# Patient Record
Sex: Female | Born: 1965 | Race: Asian | Hispanic: No | Marital: Married | Smoking: Never smoker
Health system: Southern US, Community
[De-identification: ages and names within clinical notes are randomized; demographics above are authoritative.]

## PROBLEM LIST (undated history)

## (undated) DIAGNOSIS — K5900 Constipation, unspecified: Secondary | ICD-10-CM

## (undated) DIAGNOSIS — I10 Essential (primary) hypertension: Secondary | ICD-10-CM

## (undated) DIAGNOSIS — L732 Hidradenitis suppurativa: Secondary | ICD-10-CM

## (undated) DIAGNOSIS — G4700 Insomnia, unspecified: Secondary | ICD-10-CM

## (undated) DIAGNOSIS — E079 Disorder of thyroid, unspecified: Secondary | ICD-10-CM

## (undated) HISTORY — PX: BREAST LUMPECTOMY: SHX2

## (undated) HISTORY — PX: APPENDECTOMY: SHX54

---

## 2000-01-09 ENCOUNTER — Other Ambulatory Visit: Admission: RE | Admit: 2000-01-09 | Discharge: 2000-01-09 | Payer: Self-pay | Admitting: Internal Medicine

## 2000-06-24 ENCOUNTER — Encounter: Admission: RE | Admit: 2000-06-24 | Discharge: 2000-06-24 | Payer: Self-pay | Admitting: Internal Medicine

## 2000-06-24 ENCOUNTER — Encounter: Payer: Self-pay | Admitting: Internal Medicine

## 2001-03-31 ENCOUNTER — Other Ambulatory Visit: Admission: RE | Admit: 2001-03-31 | Discharge: 2001-03-31 | Payer: Self-pay | Admitting: Internal Medicine

## 2002-03-23 ENCOUNTER — Other Ambulatory Visit: Admission: RE | Admit: 2002-03-23 | Discharge: 2002-03-23 | Payer: Self-pay | Admitting: Internal Medicine

## 2005-08-06 ENCOUNTER — Emergency Department (HOSPITAL_COMMUNITY): Admission: EM | Admit: 2005-08-06 | Discharge: 2005-08-06 | Payer: Self-pay | Admitting: Emergency Medicine

## 2010-01-23 ENCOUNTER — Emergency Department (HOSPITAL_COMMUNITY): Admission: EM | Admit: 2010-01-23 | Discharge: 2010-01-24 | Payer: Self-pay | Admitting: Emergency Medicine

## 2010-02-15 ENCOUNTER — Ambulatory Visit (HOSPITAL_COMMUNITY): Admission: RE | Admit: 2010-02-15 | Discharge: 2010-02-15 | Payer: Self-pay | Admitting: Gastroenterology

## 2011-02-05 LAB — CBC
HCT: 38.2 % (ref 36.0–46.0)
MCV: 81.6 fL (ref 78.0–100.0)
RBC: 4.69 MIL/uL (ref 3.87–5.11)
WBC: 9.5 10*3/uL (ref 4.0–10.5)

## 2011-02-05 LAB — POCT I-STAT, CHEM 8
BUN: 10 mg/dL (ref 6–23)
Calcium, Ion: 1.14 mmol/L (ref 1.12–1.32)
Chloride: 104 mEq/L (ref 96–112)
Glucose, Bld: 106 mg/dL — ABNORMAL HIGH (ref 70–99)
Potassium: 3.4 mEq/L — ABNORMAL LOW (ref 3.5–5.1)

## 2011-02-05 LAB — HEPATIC FUNCTION PANEL
ALT: 17 U/L (ref 0–35)
AST: 16 U/L (ref 0–37)
Alkaline Phosphatase: 66 U/L (ref 39–117)
Bilirubin, Direct: <0.1 mg/dL (ref 0.0–0.3)
Total Protein: 7.5 g/dL (ref 6.0–8.3)

## 2011-02-05 LAB — URINALYSIS, ROUTINE W REFLEX MICROSCOPIC
Bilirubin Urine: NEGATIVE
Ketones, ur: NEGATIVE mg/dL
Nitrite: NEGATIVE
Protein, ur: NEGATIVE mg/dL
Specific Gravity, Urine: 1.025 (ref 1.005–1.030)
Urobilinogen, UA: 0.2 mg/dL (ref 0.0–1.0)

## 2011-02-05 LAB — URINE MICROSCOPIC-ADD ON

## 2012-05-14 ENCOUNTER — Other Ambulatory Visit: Payer: Self-pay | Admitting: Hematology and Oncology

## 2012-05-28 ENCOUNTER — Emergency Department (HOSPITAL_COMMUNITY)
Admission: EM | Admit: 2012-05-28 | Discharge: 2012-05-28 | Disposition: A | Discharge status: Home / Self Care | Source: Home / Self Care | Attending: Emergency Medicine | Admitting: Emergency Medicine

## 2012-05-28 ENCOUNTER — Encounter (HOSPITAL_COMMUNITY): Payer: Self-pay | Admitting: *Deleted

## 2012-05-28 DIAGNOSIS — L0390 Cellulitis, unspecified: Secondary | ICD-10-CM

## 2012-05-28 DIAGNOSIS — L0291 Cutaneous abscess, unspecified: Secondary | ICD-10-CM

## 2012-05-28 HISTORY — DX: Hidradenitis suppurativa: L73.2

## 2012-05-28 HISTORY — DX: Disorder of thyroid, unspecified: E07.9

## 2012-05-28 MED ORDER — SULFAMETHOXAZOLE-TRIMETHOPRIM 800-160 MG PO TABS: 1.0000 | ORAL_TABLET | Freq: Two times a day (BID) | ORAL | Status: AC

## 2012-05-28 MED ORDER — LIDOCAINE HCL (PF) 2 % IJ SOLN
10.0000 mL | Freq: Once | INTRAMUSCULAR | Status: AC
  Administered 2012-05-28: 10 mL

## 2012-05-28 MED ORDER — HYDROCODONE-ACETAMINOPHEN 5-325 MG PO TABS: 2.0000 | ORAL_TABLET | ORAL | Status: AC | PRN

## 2012-05-28 MED ORDER — NAPROXEN 500 MG PO TABS: 500.0000 mg | ORAL_TABLET | Freq: Two times a day (BID) | ORAL | Status: DC

## 2012-05-28 NOTE — ED Notes (Signed)
pT  REPORTS   BOIL   IN  HER  INNNER  THIGH    NEAR   THE  PERINEAL   AREA  SHE  REPORTS   STARTED  DRAINING  SEV  DAYS  AGO    SHE  REPORTS  PAIN  AND  PRESSURE  TO  THE  AFFECTED  AREA   SHE        DENYS  ANY  OTHER  SYMPTOMS

## 2012-05-28 NOTE — ED Provider Notes (Signed)
History     CSN: [id number]  Arrival date & time 05/28/12  1336   First MD Initiated Contact with Patient 05/28/12 1433      Chief Complaint  Patient presents with  . Recurrent Skin Infections    (Consider location/radiation/quality/duration/timing/severity/associated sxs/prior treatment) HPI Comments: Patient reports a painful, erythematous mass gradually increasing size in her left perineum/groin starting 4 days ago. Has been applying warm compresses. Pain is worse with palpation. Does not recall any insect bite or trauma to the area. States it started draining some purulent material today. 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 She is not a diabetic.  ROS as noted in HPI. All other ROS negative.   Patient is a 47 y.o. female presenting with abscess. The history is provided by the patient. No language interpreter was used.  Abscess  This is a recurrent problem. The current episode started less than one week ago. The onset was gradual. The problem has been gradually worsening. The abscess is present on the groin. The abscess is characterized by redness, draining and painfulness. It is unknown what she was exposed to. The abscess first occurred at home. Pertinent negatives include no anorexia and no fever.    Past Medical History  Diagnosis Date  . Thyroid disease   . Hidradenitis     in L groin required surgery    Past Surgical History  Procedure Date  . Appendectomy   . Cesarean section     No family history on file.  History  Substance Use Topics  . Smoking status: Never Smoker   . Smokeless tobacco: Not on file  . Alcohol Use: No    OB History    Grav Para Term Preterm Abortions TAB SAB Ect Mult Living                  Review of Systems  Constitutional: Negative for fever.  Gastrointestinal: Negative for anorexia.    Allergies  Review of patient's allergies  indicates no known allergies.  Home Medications   Current Outpatient Rx  Name Route Sig Dispense Refill  . SYNTHROID PO Oral Take 250 mcg by mouth.    Marland Kitchen HYDROCODONE-ACETAMINOPHEN 5-325 MG PO TABS Oral Take 2 tablets by mouth every 4 (four) hours as needed for pain. 20 tablet 0  . NAPROXEN 500 MG PO TABS Oral Take 1 tablet (500 mg total) by mouth 2 (two) times daily. 20 tablet 0  . SULFAMETHOXAZOLE-TRIMETHOPRIM 800-160 MG PO TABS Oral Take 1 tablet by mouth 2 (two) times daily. X 10 days 20 tablet 0    BP 140/81  Pulse 88  Temp 98.1 F (36.7 C) (Oral)  Resp 16  SpO2 100%  LMP 05/25/2012  Physical Exam  Nursing note and vitals reviewed. Constitutional: She is oriented to person, place, and time. She appears well-developed and well-nourished. No distress.  HENT:  Head: Normocephalic and atraumatic.  Eyes: Conjunctivae and EOM are normal.  Neck: Normal range of motion.  Cardiovascular: Normal rate.   Pulmonary/Chest: Effort normal.  Abdominal: She exhibits no distension.  Genitourinary:          6 x 4 cm tender area of erythema, induration with a large amount of central fluctuance. Scattered pustules.  Expressible purulent drainage.   Musculoskeletal: Normal range of motion.  Neurological: She is alert and oriented to person, place, and time. Coordination normal.  Skin: Skin is warm and dry.  Psychiatric: She has a normal mood and affect. Her behavior is normal. Judgment and thought content normal.    ED Course  INCISION AND DRAINAGE Date/Time: 05/28/2012 5:32 PM Performed by: Luiz Blare Authorized by: Luiz Blare Consent: Verbal consent obtained. Risks and benefits: risks, benefits and alternatives were discussed Consent given by: patient Patient understanding: patient states understanding of the procedure being performed Patient consent: the patient's understanding of the procedure matches consent given Site marked: the operative site was  marked Required items: required blood products, implants, devices, and special equipment available Patient identity confirmed: verbally with patient Time out: Immediately prior to procedure a "time out" was called to verify the correct patient, procedure, equipment, support staff and site/side marked as required. Type: abscess Body area: anogenital Location details: perineum Anesthesia: local infiltration Local anesthetic: lidocaine 2% without epinephrine Anesthetic total: 5 ml Patient sedated: no Scalpel size: 11 Incision type: cruciate. Complexity: simple Drainage: bloody and purulent Drainage amount: moderate Wound treatment: drain placed Packing material: 1/2 in iodoform gauze Patient tolerance: Patient tolerated the procedure well with no immediate complications. Comments: Marked site with permanent marker for reference. Obtain culture, sending this off.   (including critical care time)   Labs Reviewed  CULTURE, ROUTINE-ABSCESS   No results found.   1. Abscess     MDM  Patient has obvious carbuncles, but appears to have some infected lymphatic tissue suggestive of a recurrent hidradenitis suppurativa. Patient states this is where she usually has this problem. Will send home on Norco, Naprosyn, Bactrim. Instructed her to give [place] a working phone number so we can change medications if needed. Referring to Dr. Donell Beers, surgery on call, to have this reevaluated and definitively treated if necessary. Discussed signs symptoms that should prompt her return to the department . Patient agrees with plan.   Luiz Blare, MD 05/28/12 2212

## 2012-05-31 LAB — CULTURE, ROUTINE-ABSCESS: Special Requests: NORMAL

## 2012-10-28 ENCOUNTER — Inpatient Hospital Stay (HOSPITAL_COMMUNITY)

## 2012-10-28 ENCOUNTER — Encounter (HOSPITAL_COMMUNITY): Payer: Self-pay | Admitting: *Deleted

## 2012-10-28 ENCOUNTER — Inpatient Hospital Stay (HOSPITAL_COMMUNITY)
Admission: AD | Admit: 2012-10-28 | Discharge: 2012-11-03 | DRG: 759 | Disposition: A | Discharge status: Home / Self Care | Source: Ambulatory Visit | Attending: Obstetrics | Admitting: Obstetrics

## 2012-10-28 DIAGNOSIS — N949 Unspecified condition associated with female genital organs and menstrual cycle: Secondary | ICD-10-CM | POA: Diagnosis present

## 2012-10-28 DIAGNOSIS — N739 Female pelvic inflammatory disease, unspecified: Principal | ICD-10-CM | POA: Diagnosis present

## 2012-10-28 DIAGNOSIS — J111 Influenza due to unidentified influenza virus with other respiratory manifestations: Secondary | ICD-10-CM | POA: Diagnosis present

## 2012-10-28 DIAGNOSIS — N7093 Salpingitis and oophoritis, unspecified: Secondary | ICD-10-CM

## 2012-10-28 DIAGNOSIS — N938 Other specified abnormal uterine and vaginal bleeding: Secondary | ICD-10-CM | POA: Diagnosis present

## 2012-10-28 HISTORY — DX: Constipation, unspecified: K59.00

## 2012-10-28 LAB — URINALYSIS, ROUTINE W REFLEX MICROSCOPIC
Ketones, ur: 15 mg/dL — AB
Nitrite: POSITIVE — AB
Protein, ur: >300 mg/dL — AB
Specific Gravity, Urine: 1.025 (ref 1.005–1.030)
Urobilinogen, UA: 0.2 mg/dL (ref 0.0–1.0)

## 2012-10-28 LAB — COMPREHENSIVE METABOLIC PANEL
ALT: 14 U/L (ref 0–35)
AST: 13 U/L (ref 0–37)
Alkaline Phosphatase: 76 U/L (ref 39–117)
CO2: 24 mEq/L (ref 19–32)
Calcium: 8.9 mg/dL (ref 8.4–10.5)
Chloride: 94 mEq/L — ABNORMAL LOW (ref 96–112)
GFR calc Af Amer: 76 mL/min — ABNORMAL LOW (ref >90)
GFR calc non Af Amer: 66 mL/min — ABNORMAL LOW (ref >90)
Glucose, Bld: 121 mg/dL — ABNORMAL HIGH (ref 70–99)
Potassium: 3.2 mEq/L — ABNORMAL LOW (ref 3.5–5.1)
Sodium: 130 mEq/L — ABNORMAL LOW (ref 135–145)
Total Bilirubin: 0.8 mg/dL (ref 0.3–1.2)

## 2012-10-28 LAB — URINE MICROSCOPIC-ADD ON

## 2012-10-28 LAB — CBC WITH DIFFERENTIAL/PLATELET
Basophils Absolute: 0 10*3/uL (ref 0.0–0.1)
Eosinophils Relative: 0 % (ref 0–5)
Lymphocytes Relative: 7 % — ABNORMAL LOW (ref 12–46)
Lymphs Abs: 1.4 10*3/uL (ref 0.7–4.0)
MCV: 79.6 fL (ref 78.0–100.0)
Neutro Abs: 18 10*3/uL — ABNORMAL HIGH (ref 1.7–7.7)
Platelets: 221 10*3/uL (ref 150–400)
RBC: 4.37 MIL/uL (ref 3.87–5.11)
RDW: 13.4 % (ref 11.5–15.5)
WBC: 20.8 10*3/uL — ABNORMAL HIGH (ref 4.0–10.5)

## 2012-10-28 MED ORDER — DOCUSATE SODIUM 100 MG PO CAPS
100.0000 mg | ORAL_CAPSULE | Freq: Two times a day (BID) | ORAL | Status: DC
  Administered 2012-10-28 – 2012-11-03 (×10): 100 mg via ORAL
  Filled 2012-10-28 (×11): qty 1

## 2012-10-28 MED ORDER — GENTAMICIN SULFATE 40 MG/ML IJ SOLN
130.0000 mg | Freq: Two times a day (BID) | INTRAVENOUS | Status: DC
  Administered 2012-10-28 – 2012-10-31 (×6): 130 mg via INTRAVENOUS
  Filled 2012-10-28 (×7): qty 3.25

## 2012-10-28 MED ORDER — SODIUM CHLORIDE 0.9 % IV SOLN
2.0000 g | Freq: Four times a day (QID) | INTRAVENOUS | Status: DC
  Administered 2012-10-28 – 2012-11-03 (×21): 2 g via INTRAVENOUS
  Filled 2012-10-28 (×25): qty 2000

## 2012-10-28 MED ORDER — NALOXONE HCL 0.4 MG/ML IJ SOLN: 0.4000 mg | INTRAMUSCULAR | Status: DC | PRN

## 2012-10-28 MED ORDER — DIPHENHYDRAMINE HCL 12.5 MG/5ML PO ELIX
12.5000 mg | ORAL_SOLUTION | Freq: Four times a day (QID) | ORAL | Status: DC | PRN
  Administered 2012-11-01: 12.5 mg via ORAL
  Filled 2012-10-28 (×2): qty 5

## 2012-10-28 MED ORDER — SIMETHICONE 80 MG PO CHEW
80.0000 mg | CHEWABLE_TABLET | Freq: Four times a day (QID) | ORAL | Status: DC | PRN
  Filled 2012-10-28: qty 1

## 2012-10-28 MED ORDER — CLINDAMYCIN PHOSPHATE 900 MG/50ML IV SOLN
900.0000 mg | Freq: Three times a day (TID) | INTRAVENOUS | Status: DC
  Administered 2012-10-28 – 2012-11-03 (×17): 900 mg via INTRAVENOUS
  Filled 2012-10-28 (×19): qty 50

## 2012-10-28 MED ORDER — ONDANSETRON HCL 4 MG PO TABS
4.0000 mg | ORAL_TABLET | Freq: Four times a day (QID) | ORAL | Status: DC | PRN
  Administered 2012-10-31: 4 mg via ORAL
  Filled 2012-10-28: qty 1

## 2012-10-28 MED ORDER — SODIUM CHLORIDE 0.9 % IJ SOLN: 9.0000 mL | INTRAMUSCULAR | Status: DC | PRN

## 2012-10-28 MED ORDER — IBUPROFEN 600 MG PO TABS
600.0000 mg | ORAL_TABLET | Freq: Four times a day (QID) | ORAL | Status: DC | PRN
  Administered 2012-10-29: 600 mg via ORAL
  Filled 2012-10-28: qty 1

## 2012-10-28 MED ORDER — SODIUM CHLORIDE 0.9 % IV SOLN
3.0000 g | Freq: Four times a day (QID) | INTRAVENOUS | Status: DC
  Filled 2012-10-28: qty 3

## 2012-10-28 MED ORDER — INFLUENZA VIRUS VACC SPLIT PF IM SUSP: 0.5000 mL | INTRAMUSCULAR | Status: DC

## 2012-10-28 MED ORDER — DIPHENHYDRAMINE HCL 50 MG/ML IJ SOLN: 12.5000 mg | Freq: Four times a day (QID) | INTRAMUSCULAR | Status: DC | PRN

## 2012-10-28 MED ORDER — HYDROMORPHONE 0.3 MG/ML IV SOLN
INTRAVENOUS | Status: DC
  Administered 2012-10-28: 21:00:00 via INTRAVENOUS
  Administered 2012-10-29: 0.9 mg via INTRAVENOUS
  Administered 2012-10-29: 1.2 mg via INTRAVENOUS
  Administered 2012-10-29: 0.6 mg via INTRAVENOUS
  Administered 2012-10-29: 1.2 mg via INTRAVENOUS
  Administered 2012-10-29: 0.6 mg via INTRAVENOUS
  Administered 2012-10-30 (×2): 0.9 mg via INTRAVENOUS
  Filled 2012-10-28: qty 25

## 2012-10-28 MED ORDER — ONDANSETRON HCL 4 MG/2ML IJ SOLN: 4.0000 mg | Freq: Four times a day (QID) | INTRAMUSCULAR | Status: DC | PRN

## 2012-10-28 MED ORDER — PRENATAL MULTIVITAMIN CH
1.0000 | ORAL_TABLET | Freq: Every day | ORAL | Status: DC
  Administered 2012-10-28 – 2012-11-03 (×6): 1 via ORAL
  Filled 2012-10-28 (×8): qty 1

## 2012-10-28 MED ORDER — ZOLPIDEM TARTRATE 5 MG PO TABS: 5.0000 mg | ORAL_TABLET | Freq: Every evening | ORAL | Status: DC | PRN

## 2012-10-28 MED ORDER — LACTATED RINGERS IV SOLN
INTRAVENOUS | Status: DC
  Administered 2012-10-28 – 2012-11-03 (×10): via INTRAVENOUS

## 2012-10-28 NOTE — MAU Provider Note (Signed)
History     CSN: [id number]  Arrival date and time: 10/28/12 1806   None     Chief Complaint  Patient presents with  . Abdominal Pain   HPI  Pt is not pregnant and was seen at the ED Jill Bird this weekend and treated for PID.  Pt was discharged home with antibiotics and pain meds. Pt's pain continued and pt saw Dr. Clearance Bird who sent pt here with orders for labs and ultrasound and possible admission. Pt has had chills, but no fever.  Pt's pain is no better.  Past Medical History  Diagnosis Date  . Thyroid disease   . Hidradenitis     in L groin required surgery  . Constipation     Past Surgical History  Procedure Date  . Appendectomy   . Cesarean section   . Breast lumpectomy     benign    History reviewed. No pertinent family history.  History  Substance Use Topics  . Smoking status: Never Smoker   . Smokeless tobacco: Never Used  . Alcohol Use: No    Allergies: No Known Allergies  Prescriptions prior to admission  Medication Sig Dispense Refill  . doxycycline (VIBRAMYCIN) 100 MG capsule Take 100 mg by mouth 2 (two) times daily. Pt started 10/26/2012, to take for 5 days.      Marland Kitchen HYDROcodone-acetaminophen (VICODIN) 5-500 MG per tablet Take 1 tablet by mouth every 6 (six) hours as needed. For pain      . ibuprofen (ADVIL,MOTRIN) 200 MG tablet Take 200 mg by mouth daily as needed. For pain      . levothyroxine (SYNTHROID, LEVOTHROID) 125 MCG tablet Take 125 mcg by mouth daily. Take with for a dose of .      Marland Kitchen levothyroxine (SYNTHROID, LEVOTHROID) 25 MCG tablet Take 25 mcg by mouth daily. Take with for a dose of .        Review of Systems  Constitutional: Positive for chills and diaphoresis. Negative for fever.  Gastrointestinal: Positive for abdominal pain and constipation. Negative for nausea, vomiting and diarrhea.       Pt has been constipated but had a bowel movement with much effort this morning.  Genitourinary: Negative for  dysuria.  Neurological: Positive for dizziness and headaches.   Physical Exam   Blood pressure 109/61, pulse 109, temperature 98.1 F (36.7 C), temperature source Oral, resp. rate 18, last menstrual period 10/07/2012.  Physical Exam  Vitals reviewed. Constitutional: She is oriented to person, place, and time. She appears well-developed and well-nourished.       Uncomfortable appearing  HENT:  Head: Normocephalic.  Eyes: Pupils are equal, round, and reactive to light.  Neck: Normal range of motion. Neck supple.  Cardiovascular: Normal rate, regular rhythm and normal heart sounds.   Respiratory: Effort normal and breath sounds normal.       No CVA tenderness  GI: She exhibits distension. There is tenderness. There is guarding. There is no rebound.       Extremely tender with palpation- diffuse- bilateral  Musculoskeletal: Normal range of motion.  Neurological: She is alert and oriented to person, place, and time.  Skin: Skin is warm and dry.  Psychiatric: She has a normal mood and affect.    MAU Course  Procedures Results for orders placed during the hospital encounter of 10/28/12 (from the past 24 hour(s))  URINALYSIS, ROUTINE W REFLEX MICROSCOPIC     Status: Abnormal   Collection Time   10/28/12  6:25 PM  Component Value Range   Color, Urine AMBER (*) YELLOW   APPearance CLOUDY (*) CLEAR   Specific Gravity, Urine 1.025  1.005 - 1.030   pH 5.5  5.0 - 8.0   Glucose, UA 100 (*) NEGATIVE mg/dL   Hgb urine dipstick LARGE (*) NEGATIVE   Bilirubin Urine SMALL (*) NEGATIVE   Ketones, ur 15 (*) NEGATIVE mg/dL   Protein, ur >161 (*) NEGATIVE mg/dL   Urobilinogen, UA 0.2  0.0 - 1.0 mg/dL   Nitrite POSITIVE (*) NEGATIVE   Leukocytes, UA NEGATIVE  NEGATIVE  URINE MICROSCOPIC-ADD ON     Status: Abnormal   Collection Time   10/28/12  6:25 PM      Component Value Range   Squamous Epithelial / LPF FEW (*) RARE   WBC, UA 0-2  <3 WBC/hpf   RBC / HPF 3-6  <3 RBC/hpf   Bacteria,  UA MANY (*) RARE   Casts GRANULAR CAST (*) NEGATIVE   Urine-Other YEAST    CBC WITH DIFFERENTIAL     Status: Abnormal   Collection Time   10/28/12  6:30 PM      Component Value Range   WBC 20.8 (*) 4.0 - 10.5 K/uL   RBC 4.37  3.87 - 5.11 MIL/uL   Hemoglobin 12.1  12.0 - 15.0 g/dL   HCT 09.6 (*) 04.5 - 40.9 %   MCV 79.6  78.0 - 100.0 fL   MCH 27.7  26.0 - 34.0 pg   MCHC 34.8  30.0 - 36.0 g/dL   RDW 81.1  91.4 - 78.2 %   Platelets 221  150 - 400 K/uL   Neutrophils Relative 87 (*) 43 - 77 %   Neutro Abs 18.0 (*) 1.7 - 7.7 K/uL   Lymphocytes Relative 7 (*) 12 - 46 %   Lymphs Abs 1.4  0.7 - 4.0 K/uL   Monocytes Relative 7  3 - 12 %   Monocytes Absolute 1.4 (*) 0.1 - 1.0 K/uL   Eosinophils Relative 0  0 - 5 %   Eosinophils Absolute 0.0  0.0 - 0.7 K/uL   Basophils Relative 0  0 - 1 %   Basophils Absolute 0.0  0.0 - 0.1 K/uL  COMPREHENSIVE METABOLIC PANEL     Status: Abnormal   Collection Time   10/28/12  6:30 PM      Component Value Range   Sodium 130 (*) 135 - 145 mEq/L   Potassium 3.2 (*) 3.5 - 5.1 mEq/L   Chloride 94 (*) 96 - 112 mEq/L   CO2 24  19 - 32 mEq/L   Glucose, Bld 121 (*) 70 - 99 mg/dL   BUN 8  6 - 23 mg/dL   Creatinine, Ser 9.56  0.50 - 1.10 mg/dL   Calcium 8.9  8.4 - 21.3 mg/dL   Total Protein 7.8  6.0 - 8.3 g/dL   Albumin 3.0 (*) 3.5 - 5.2 g/dL   AST 13  0 - 37 U/L   ALT 14  0 - 35 U/L   Alkaline Phosphatase 76  39 - 117 U/L   Total Bilirubin 0.8  0.3 - 1.2 mg/dL   GFR calc non Af Amer 66 (*) >90 mL/min   GFR calc Af Amer 76 (*) >90 mL/min  RADIOLOGY REPORT*  Clinical Data: Pelvic pain. Pelvic inflammatory disease.  Leukocytosis.  TRANSABDOMINAL AND TRANSVAGINAL ULTRASOUND OF PELVIS  Technique: Both transabdominal and transvaginal ultrasound  examinations of the pelvis were performed. Transabdominal  technique was performed for  global imaging of the pelvis including  uterus, ovaries, adnexal regions, and pelvic cul-de-sac.  It was necessary to proceed  with endovaginal exam following the  transabdominal exam to visualize the complex left adnexal mass.  Comparison: CT on 01/24/2010  Findings:  Uterus: 10.7 x 5.3 x 5.9 cm. Retroflexed. No fibroids or other  uterine mass identified.  Endometrium: Double layer thickness measures 9 mm transvaginally.  No focal lesion visualized.  Right ovary: Not well visualized. A small complex cystic lesion is  seen which measures approximately 2.4 x 1.5 x 1.7 cm. This shows  no central blood flow on color Doppler ultrasound. Peripheral  blood flow is demonstrated.  Left ovary: Not well visualized. A complex cystic lesion with  thick septations is seen which measures 3.9 x 3.2 x 2.6 cm. This  shows no evidence of central blood flow on color Doppler ultrasound  although peripheral blood flow is seen.  Other Findings: No free fluid  IMPRESSION:  1. Small bilateral indeterminate complex cystic adnexal masses.  Differential diagnosis includes bilateral tubal ovarian abscesses  and endometriosis. Less likely considerations include hemorrhagic  ovarian cysts or cystic ovarian neoplasms. Recommend follow-up by  pelvic ultrasound in 6-12 weeks. This recommendation follows the  consensus statement: Management of Asymptomatic Ovarian and Other  Adnexal Cysts Imaged at [place]: Society of Radiologists in Ultrasound  Consensus Conference Statement. Radiology 2010; [phone].  2. Normal appearance of the uterus. No evidence of free fluid.   Discussed with Dr. Clearance Bird- will admit pt for triple IV antibiotic therapy and pain management of TOA Pt also has a UTI- urine culture is pending Assessment and Plan    Jill Bird 10/28/2012, 6:46 PM

## 2012-10-28 NOTE — H&P (Signed)
Jill Bird is an 46 y.o. female. Presents with pelvic pain.  Pertinent Gynecological History: Menses: flow is moderate Bleeding: dysfunctional uterine bleeding Contraception: none DES exposure: denies Blood transfusions: none Sexually transmitted diseases: no past history Previous GYN Procedures: None  Last mammogram: normal Date: 2012 Last pap: normal Date: 2012 OB History: G1, P0102   Menstrual History: Menarche age:  67 Patient's last menstrual period was 10/07/2012.    Past Medical History  Diagnosis Date  . Thyroid disease   . Hidradenitis     in L groin required surgery  . Constipation     Past Surgical History  Procedure Date  . Appendectomy   . Cesarean section   . Breast lumpectomy     benign    History reviewed. No pertinent family history.  Social History:  reports that she has never smoked. She has never used smokeless tobacco. She reports that she does not drink alcohol or use illicit drugs.  Allergies: No Known Allergies  Prescriptions prior to admission  Medication Sig Dispense Refill  . doxycycline (VIBRAMYCIN) 100 MG capsule Take 100 mg by mouth 2 (two) times daily. Pt started 10/26/2012, to take for 5 days.      Marland Kitchen HYDROcodone-acetaminophen (VICODIN) 5-500 MG per tablet Take 1 tablet by mouth every 6 (six) hours as needed. For pain      . ibuprofen (ADVIL,MOTRIN) 200 MG tablet Take 200 mg by mouth daily as needed. For pain      . levothyroxine (SYNTHROID, LEVOTHROID) 100 MCG tablet Take 100 mcg by mouth daily. Takes with tablet to make a dose.      . levothyroxine (SYNTHROID, LEVOTHROID) 125 MCG tablet Take 125 mcg by mouth daily. Take with for a dose of .        Review of Systems  Constitutional: Positive for chills and diaphoresis.  All other systems reviewed and are negative.    Blood pressure 117/61, pulse 108, temperature 99.6 F (37.6 C), temperature source Oral, resp. rate 22, height 5\' 5"  (1.651 m),  weight 188 lb 0.5 oz (85.29 kg), last menstrual period 10/07/2012, SpO2 97.00%. Physical Exam  Nursing note and vitals reviewed. Constitutional: She is oriented to person, place, and time. She appears well-developed and well-nourished.  HENT:  Head: Normocephalic and atraumatic.  Eyes: Conjunctivae normal are normal. Pupils are equal, round, and reactive to light.  Neck: Normal range of motion. Neck supple.  Cardiovascular: Normal rate and regular rhythm.   Respiratory: Effort normal.  GI: Soft. There is tenderness.  Genitourinary: Vaginal discharge found.  Neurological: She is alert and oriented to person, place, and time.  Skin: Skin is warm and dry.  Psychiatric: She has a normal mood and affect. Her behavior is normal. Judgment and thought content normal.    Results for orders placed during the hospital encounter of 10/28/12 (from the past 24 hour(s))  URINALYSIS, ROUTINE W REFLEX MICROSCOPIC     Status: Abnormal   Collection Time   10/28/12  6:25 PM      Component Value Range   Color, Urine AMBER (*) YELLOW   APPearance CLOUDY (*) CLEAR   Specific Gravity, Urine 1.025  1.005 - 1.030   pH 5.5  5.0 - 8.0   Glucose, UA 100 (*) NEGATIVE mg/dL   Hgb urine dipstick LARGE (*) NEGATIVE   Bilirubin Urine SMALL (*) NEGATIVE   Ketones, ur 15 (*) NEGATIVE mg/dL   Protein, ur >161 (*) NEGATIVE mg/dL   Urobilinogen, UA 0.2  0.0 - 1.0 mg/dL   Nitrite POSITIVE (*) NEGATIVE   Leukocytes, UA NEGATIVE  NEGATIVE  URINE MICROSCOPIC-ADD ON     Status: Abnormal   Collection Time   10/28/12  6:25 PM      Component Value Range   Squamous Epithelial / LPF FEW (*) RARE   WBC, UA 0-2  <3 WBC/hpf   RBC / HPF 3-6  <3 RBC/hpf   Bacteria, UA MANY (*) RARE   Casts GRANULAR CAST (*) NEGATIVE   Urine-Other YEAST    CBC WITH DIFFERENTIAL     Status: Abnormal   Collection Time   10/28/12  6:30 PM      Component Value Range   WBC 20.8 (*) 4.0 - 10.5 K/uL   RBC 4.37  3.87 - 5.11 MIL/uL   Hemoglobin  12.1  12.0 - 15.0 g/dL   HCT 16.1 (*) 09.6 - 04.5 %   MCV 79.6  78.0 - 100.0 fL   MCH 27.7  26.0 - 34.0 pg   MCHC 34.8  30.0 - 36.0 g/dL   RDW 40.9  81.1 - 91.4 %   Platelets 221  150 - 400 K/uL   Neutrophils Relative 87 (*) 43 - 77 %   Neutro Abs 18.0 (*) 1.7 - 7.7 K/uL   Lymphocytes Relative 7 (*) 12 - 46 %   Lymphs Abs 1.4  0.7 - 4.0 K/uL   Monocytes Relative 7  3 - 12 %   Monocytes Absolute 1.4 (*) 0.1 - 1.0 K/uL   Eosinophils Relative 0  0 - 5 %   Eosinophils Absolute 0.0  0.0 - 0.7 K/uL   Basophils Relative 0  0 - 1 %   Basophils Absolute 0.0  0.0 - 0.1 K/uL  COMPREHENSIVE METABOLIC PANEL     Status: Abnormal   Collection Time   10/28/12  6:30 PM      Component Value Range   Sodium 130 (*) 135 - 145 mEq/L   Potassium 3.2 (*) 3.5 - 5.1 mEq/L   Chloride 94 (*) 96 - 112 mEq/L   CO2 24  19 - 32 mEq/L   Glucose, Bld 121 (*) 70 - 99 mg/dL   BUN 8  6 - 23 mg/dL   Creatinine, Ser 7.82  0.50 - 1.10 mg/dL   Calcium 8.9  8.4 - 95.6 mg/dL   Total Protein 7.8  6.0 - 8.3 g/dL   Albumin 3.0 (*) 3.5 - 5.2 g/dL   AST 13  0 - 37 U/L   ALT 14  0 - 35 U/L   Alkaline Phosphatase 76  39 - 117 U/L   Total Bilirubin 0.8  0.3 - 1.2 mg/dL   GFR calc non Af Amer 66 (*) >90 mL/min   GFR calc Af Amer 76 (*) >90 mL/min    US Transvaginal Non-ob  10/28/2012  *RADIOLOGY REPORT*  Clinical Data: Pelvic pain.  Pelvic inflammatory disease. Leukocytosis.  TRANSABDOMINAL AND TRANSVAGINAL ULTRASOUND OF PELVIS  Technique:  Both transabdominal and transvaginal ultrasound examinations of the pelvis were performed.  Transabdominal technique was performed for global imaging of the pelvis including uterus, ovaries, adnexal regions, and pelvic cul-de-sac.  It was necessary to proceed with endovaginal exam following the transabdominal exam to visualize the complex left adnexal mass.  Comparison:  CT on 01/24/2010  Findings: Uterus:  10.7 x 5.3 x 5.9 cm.  Retroflexed.  No fibroids or other uterine mass identified.   Endometrium: Double layer thickness measures 9 mm transvaginally. No focal  lesion visualized.  Right ovary: Not well visualized.  A small complex cystic lesion is seen which measures approximately 2.4 x 1.5 x 1.7 cm.  This shows no central blood flow on color Doppler ultrasound.  Peripheral blood flow is demonstrated.  Left ovary: Not well visualized.  A complex cystic lesion with thick septations is seen which measures 3.9 x 3.2 x 2.6 cm.  This shows no evidence of central blood flow on color Doppler ultrasound although peripheral blood flow is seen.  Other Findings:  No free fluid  IMPRESSION:  1. Small bilateral indeterminate complex cystic adnexal masses. Differential diagnosis includes bilateral tubal ovarian abscesses and endometriosis.  Less likely considerations include hemorrhagic ovarian cysts or cystic ovarian neoplasms.  Recommend follow-up by pelvic ultrasound in 6-12 weeks. This recommendation follows the consensus statement:  Management of Asymptomatic Ovarian and Other Adnexal Cysts Imaged at [place]:  Society of Radiologists in Ultrasound Consensus Conference Statement.  Radiology 2010; [phone]. 2.  Normal appearance of the uterus.  No evidence of free fluid.   Original Report Authenticated By: Myles Rosenthal, M.D.    US Pelvis Complete  10/28/2012  *RADIOLOGY REPORT*  Clinical Data: Pelvic pain.  Pelvic inflammatory disease. Leukocytosis.  TRANSABDOMINAL AND TRANSVAGINAL ULTRASOUND OF PELVIS  Technique:  Both transabdominal and transvaginal ultrasound examinations of the pelvis were performed.  Transabdominal technique was performed for global imaging of the pelvis including uterus, ovaries, adnexal regions, and pelvic cul-de-sac.  It was necessary to proceed with endovaginal exam following the transabdominal exam to visualize the complex left adnexal mass.  Comparison:  CT on 01/24/2010  Findings: Uterus:  10.7 x 5.3 x 5.9 cm.  Retroflexed.  No fibroids or other uterine mass identified.   Endometrium: Double layer thickness measures 9 mm transvaginally. No focal lesion visualized.  Right ovary: Not well visualized.  A small complex cystic lesion is seen which measures approximately 2.4 x 1.5 x 1.7 cm.  This shows no central blood flow on color Doppler ultrasound.  Peripheral blood flow is demonstrated.  Left ovary: Not well visualized.  A complex cystic lesion with thick septations is seen which measures 3.9 x 3.2 x 2.6 cm.  This shows no evidence of central blood flow on color Doppler ultrasound although peripheral blood flow is seen.  Other Findings:  No free fluid  IMPRESSION:  1. Small bilateral indeterminate complex cystic adnexal masses. Differential diagnosis includes bilateral tubal ovarian abscesses and endometriosis.  Less likely considerations include hemorrhagic ovarian cysts or cystic ovarian neoplasms.  Recommend follow-up by pelvic ultrasound in 6-12 weeks. This recommendation follows the consensus statement:  Management of Asymptomatic Ovarian and Other Adnexal Cysts Imaged at [place]:  Society of Radiologists in Ultrasound Consensus Conference Statement.  Radiology 2010; [phone]. 2.  Normal appearance of the uterus.  No evidence of free fluid.   Original Report Authenticated By: Myles Rosenthal, M.D.     Assessment/Plan: PID/TOA.  Admit.  IV antibiotics.  HARPER,CHARLES A 10/28/2012, 11:52 PM

## 2012-10-28 NOTE — MAU Note (Signed)
Seen in ED in [place] this weekend, was tx'd for PID.  Dc'd home with antibiotics & pain meds, saw Dr. Clearance Coots this evening, was sent to MAU for possible admission. Severe pain continues.

## 2012-10-28 NOTE — Progress Notes (Signed)
ANTIBIOTIC CONSULT NOTE - INITIAL  Pharmacy Consult for Gentamicin Indication: TOA  No Known Allergies  Patient Measurements: Height: 5\' 5"  (165.1 cm) Weight: 188 lb 0.5 oz (85.29 kg) IBW/kg (Calculated) : 57  Adjusted Body Weight: 65.6kg  Vital Signs: Temp: 99.1 F (37.3 C) (12/17 2054) Temp src: Oral (12/17 2054) BP: 117/62 mmHg (12/17 2054) Pulse Rate: 87  (12/17 2054)  Labs:  Basename 10/28/12 1830  WBC 20.8*  HGB 12.1  PLT 221  LABCREA --  CREATININE 1.02  CRCLEARANCE --   No results found for this basename: GENTTROUGH:2,GENTPEAK:2,GENTRANDOM:2, in the last 72 hours   Microbiology: No results found for this or any previous visit (from the past 720 hour(s)).  Medications:  Ampicillin 2 grams IV every 6 hours. Clindamycin 900mg  IV every 8 hours.  Assessment: 46 y.o. non pregnant female admitted for TOA.  Patient has been on oral abx since this weekend for PID.  She is still in a lot of pain with chills, but no fever.  Admitted for triple abx and pain management.  Per MAU note, the patient also has a UTI with urine cx pending.    Estimated Ke = 0.196 , Vd = 0.3L/kg, SCr = 1.02mg /dL  Goal of Therapy:  Gentamicin peak 6-8 mg/L and Trough < 1 mg/L  Plan:  Gentamicin 130 mg IV every 12 hrs  Will check gentamicin levels if continued > 72hr or clinically indicated.  Berlin Hun D 10/28/2012,9:05 PM

## 2012-10-28 NOTE — MAU Note (Signed)
Pt states she received toradol injection at MD office.

## 2012-10-29 ENCOUNTER — Encounter (HOSPITAL_COMMUNITY): Payer: Self-pay | Admitting: Obstetrics

## 2012-10-29 MED ORDER — FLUCONAZOLE 150 MG PO TABS
150.0000 mg | ORAL_TABLET | Freq: Once | ORAL | Status: AC
  Administered 2012-10-29: 150 mg via ORAL
  Filled 2012-10-29: qty 1

## 2012-10-29 MED ORDER — LEVOTHYROXINE SODIUM 75 MCG PO TABS
225.0000 ug | ORAL_TABLET | Freq: Every day | ORAL | Status: DC
  Administered 2012-10-29 – 2012-11-03 (×5): 225 ug via ORAL
  Filled 2012-10-29 (×7): qty 3

## 2012-10-29 MED ORDER — INFLUENZA VIRUS VACC SPLIT PF IM SUSP: 0.5000 mL | INTRAMUSCULAR | Status: AC

## 2012-10-29 MED ORDER — IBUPROFEN 600 MG PO TABS
600.0000 mg | ORAL_TABLET | Freq: Four times a day (QID) | ORAL | Status: DC
  Administered 2012-10-29 – 2012-11-02 (×12): 600 mg via ORAL
  Filled 2012-10-29 (×12): qty 1

## 2012-10-29 NOTE — Progress Notes (Signed)
Subjective: Patient reports a little less pain.  Objective: I have reviewed patient's vital signs, intake and output, medications, labs and microbiology.  General: alert and no distress Resp: clear to auscultation bilaterally Cardio: regular rate and rhythm, S1, S2 normal, no murmur, click, rub or gallop GI: abnormal findings:  moderate tenderness in the RLQ and in the LLQ Extremities: extremities normal, atraumatic, no cyanosis or edema Vaginal Bleeding: none   Assessment/Plan: PID/TOA.  Stable.  Continue triple antibiotics.  Check cultures.  LOS: 1 day    Nelida Mandarino A 10/29/2012, 11:12 AM

## 2012-10-30 LAB — URINE CULTURE
Colony Count: NO GROWTH
Culture: NO GROWTH

## 2012-10-30 MED ORDER — HYDROMORPHONE HCL 2 MG PO TABS
4.0000 mg | ORAL_TABLET | ORAL | Status: DC | PRN
  Administered 2012-10-30 – 2012-11-03 (×7): 4 mg via ORAL
  Filled 2012-10-30 (×6): qty 2
  Filled 2012-10-30 (×2): qty 1

## 2012-10-30 MED ORDER — VALACYCLOVIR HCL 500 MG PO TABS
2000.0000 mg | ORAL_TABLET | Freq: Two times a day (BID) | ORAL | Status: AC
  Administered 2012-10-30: 2000 mg via ORAL
  Filled 2012-10-30 (×2): qty 4

## 2012-10-30 NOTE — Progress Notes (Signed)
Subjective: Patient reports less pain.  Objective: I have reviewed patient's vital signs, intake and output, medications, labs and microbiology.  Abdomen:  Diffusely tender lower quadrants.   Assessment/Plan: PID/TOA.  Stable.  Continue triple antibiotics.  Start po analgesic.  D/C PCA.  LOS: 2 days    Jill Bird 10/30/2012, 9:16 AM

## 2012-10-31 LAB — CREATININE, SERUM
Creatinine, Ser: 0.82 mg/dL (ref 0.50–1.10)
GFR calc non Af Amer: 86 mL/min — ABNORMAL LOW (ref >90)

## 2012-10-31 LAB — GENTAMICIN LEVEL, TROUGH: Gentamicin Trough: 0.9 ug/mL (ref 0.5–2.0)

## 2012-10-31 MED ORDER — GENTAMICIN SULFATE 40 MG/ML IJ SOLN
150.0000 mg | Freq: Two times a day (BID) | INTRAVENOUS | Status: DC
  Administered 2012-10-31 – 2012-11-03 (×6): 150 mg via INTRAVENOUS
  Filled 2012-10-31 (×7): qty 3.75

## 2012-10-31 NOTE — Progress Notes (Signed)
Chaplain told at rotation time of request by patient for Chaplain support. Arrived in unit at 1650. Ms Lenis's anxiety over her diagnosis has lessen after discussions with physicians and nurses. She is still concerned about what may develop but is hopeful that she will be cured. Her religious faith is a sustaining factor in her life. She reports a strong family support system, but worries that her health conditions my cause worry to others.  Prayer and spiritual care given. Please page chaplain if he can assist.   Jill Bird. Harland Aguiniga, D.Min., APC Chaplain

## 2012-11-01 ENCOUNTER — Inpatient Hospital Stay (HOSPITAL_COMMUNITY)

## 2012-11-01 LAB — CBC WITH DIFFERENTIAL/PLATELET
Basophils Absolute: 0 10*3/uL (ref 0.0–0.1)
Eosinophils Absolute: 0.1 10*3/uL (ref 0.0–0.7)
Lymphs Abs: 0.8 10*3/uL (ref 0.7–4.0)
MCH: 27 pg (ref 26.0–34.0)
MCHC: 34.8 g/dL (ref 30.0–36.0)
MCV: 77.4 fL — ABNORMAL LOW (ref 78.0–100.0)
Monocytes Absolute: 0.7 10*3/uL (ref 0.1–1.0)
Platelets: 259 10*3/uL (ref 150–400)
RDW: 14.1 % (ref 11.5–15.5)
WBC: 9 10*3/uL (ref 4.0–10.5)

## 2012-11-01 LAB — INFLUENZA PANEL BY PCR (TYPE A & B)
Influenza A By PCR: POSITIVE — AB
Influenza B By PCR: NEGATIVE

## 2012-11-01 MED ORDER — OSELTAMIVIR PHOSPHATE 75 MG PO CAPS
75.0000 mg | ORAL_CAPSULE | Freq: Two times a day (BID) | ORAL | Status: DC
  Administered 2012-11-01 – 2012-11-03 (×4): 75 mg via ORAL
  Filled 2012-11-01 (×6): qty 1

## 2012-11-01 MED ORDER — GUAIFENESIN-DM 100-10 MG/5ML PO SYRP
10.0000 mL | ORAL_SOLUTION | ORAL | Status: DC | PRN
  Administered 2012-11-01 – 2012-11-02 (×4): 10 mL via ORAL
  Filled 2012-11-01 (×3): qty 10

## 2012-11-01 NOTE — Progress Notes (Signed)
Subjective: Patient reports less pain.    Objective: I have reviewed patient's vital signs, intake and output, medications, labs and microbiology.  General: alert and no distress GI: abnormal findings:  mild tenderness in the RLQ and in the LLQ Extremities: extremities normal, atraumatic, no cyanosis or edema Vaginal Bleeding: none   Assessment/Plan: Pelvic.  Probable PID/TOA.  Improved.  Continue IV triples x 5-7 days total`.  LOS: 4 days    Jill Bird A 11/01/2012, 7:50 AM

## 2012-11-02 LAB — BASIC METABOLIC PANEL
CO2: 27 mEq/L (ref 19–32)
Calcium: 8.2 mg/dL — ABNORMAL LOW (ref 8.4–10.5)
Creatinine, Ser: 1.03 mg/dL (ref 0.50–1.10)
GFR calc non Af Amer: 65 mL/min — ABNORMAL LOW (ref >90)
Glucose, Bld: 95 mg/dL (ref 70–99)
Sodium: 135 mEq/L (ref 135–145)

## 2012-11-02 MED ORDER — IBUPROFEN 600 MG PO TABS: 600.0000 mg | ORAL_TABLET | Freq: Four times a day (QID) | ORAL | Status: DC

## 2012-11-02 MED ORDER — IBUPROFEN 600 MG PO TABS
600.0000 mg | ORAL_TABLET | Freq: Four times a day (QID) | ORAL | Status: DC
  Administered 2012-11-02: 600 mg via ORAL
  Filled 2012-11-02: qty 1

## 2012-11-02 NOTE — Progress Notes (Addendum)
ANTIBIOTIC CONSULT NOTE - FOLLOW UP  Pharmacy Consult for Gentamicin Indication: PID/TOA  No Known Allergies  Patient Measurements: Height: 5\' 5"  (165.1 cm) Weight: 188 lb (85.276 kg) IBW/kg (Calculated) : 57  Adjusted Body Weight: 65.6 kg  Vital Signs: Temp: 98.2 F (36.8 C) (12/22 1000) Temp src: Oral (12/22 1000) BP: 130/69 mmHg (12/22 1000) Pulse Rate: 78  (12/22 1000) Intake/Output from previous day: 12/21 0701 - 12/22 0700 In: 45409 [P.O.:2270; I.V.:6982.5; IV Piggyback:957.5] Out: 4450 [Urine:4450] Intake/Output from this shift: Total I/O In: 120 [P.O.:120] Out: -   Labs:  Basename 11/02/12 0900 11/01/12 1200 10/31/12 1024  WBC -- 9.0 --  HGB -- 10.0* --  PLT -- 259 --  LABCREA -- -- --  CREATININE 1.03 -- 0.82   Estimated Creatinine Clearance: 75.2 ml/min (by C-G formula based on Cr of 1.03).  Basename 10/31/12 1354 10/31/12 1024  VANCOTROUGH -- --  Leodis Binet -- --  Drue Dun -- --  GENTTROUGH -- 0.9  GENTPEAK 4.1* --  GENTRANDOM -- --  TOBRATROUGH -- --  TOBRAPEAK -- --  TOBRARND -- --  AMIKACINPEAK -- --  AMIKACINTROU -- --  AMIKACIN -- --     Microbiology: Recent Results (from the past 720 hour(s))  URINE CULTURE     Status: Normal   Collection Time   10/28/12  6:25 PM      Component Value Range Status Comment   Specimen Description URINE, CLEAN CATCH   Final    Special Requests NONE   Final    Culture  Setup Time 10/29/2012 19:04   Final    Colony Count NO GROWTH   Final    Culture NO GROWTH   Final    Report Status 10/30/2012 FINAL   Final    Influenza A +; H1N1 +  Anti-infectives     Start     Dose/Rate Route Frequency Ordered Stop   11/01/12 2200   oseltamivir (TAMIFLU) capsule 75 mg        75 mg Oral 2 times daily 11/01/12 1905 11/06/12 2159   10/31/12 2300   gentamicin (GARAMYCIN) 150 mg in dextrose 5 % 50 mL IVPB        150 mg 107.5 mL/hr over 30 Minutes Intravenous Every 12 hours 10/31/12 1517     10/30/12 1000    valACYclovir (VALTREX) tablet 2,000 mg        2,000 mg Oral 2 times daily 10/30/12 0927 10/31/12 0959   10/29/12 1100   fluconazole (DIFLUCAN) tablet 150 mg        150 mg Oral  Once 10/29/12 1004 10/29/12 1050   10/28/12 2300   gentamicin (GARAMYCIN) 130 mg in dextrose 5 % 50 mL IVPB  Status:  Discontinued        130 mg 106.5 mL/hr over 30 Minutes Intravenous Every 12 hours 10/28/12 2100 10/31/12 1517   10/28/12 2200   clindamycin (CLEOCIN) IVPB 900 mg        900 mg 100 mL/hr over 30 Minutes Intravenous 3 times per day 10/28/12 1957     10/28/12 2100   Ampicillin-Sulbactam (UNASYN) 3 g in sodium chloride 0.9 % 100 mL IVPB  Status:  Discontinued        3 g 100 mL/hr over 60 Minutes Intravenous 4 times per day 10/28/12 1957 10/28/12 2002   10/28/12 2100   ampicillin (OMNIPEN) 2 g in sodium chloride 0.9 % 50 mL IVPB        2 g 150 mL/hr over  20 Minutes Intravenous 4 times per day 10/28/12 2005            Assessment: Pt still spiking temps. Tested positive for Influenza A and H1N1.  Started on Tamiflu 11/01/12. Pt is on Day 5 of Ampicillin, Gentamicin, & Clindamycin.  WBC yesterday was down to 9.  SCr from today is 1.03.  Pt's SCr on admission was 1.02 which dropped to 0.82 and has now increased back to 1.03.  Goal of Therapy:  Gentamicin peak 7-8 mcg/ml and Gentamicin trough <1 mcg/ml  Plan:  Continue gentamicin at current dose.   Will check a gentamicin trough level with next dose to make sure pt is clearing medication appropriately.    Natasha Bence 11/02/2012,12:13 PM

## 2012-11-02 NOTE — Progress Notes (Signed)
Subjective: Patient reports less pain.  Objective: I have reviewed patient's vital signs, intake and output, medications, labs and microbiology.  General: alert and no distress Resp: clear to auscultation bilaterally Cardio: regular rate and rhythm, S1, S2 normal, no murmur, click, rub or gallop GI: abnormal findings:  mild tenderness in the RLQ and in the LLQ Extremities: extremities normal, atraumatic, no cyanosis or edema Vaginal Bleeding: none   Assessment/Plan: Pelvic pain.  Probable PID/TOA.  Improved.  Recent febrile episode with positive Influenza A testing.  Tamiflu started.  Continue triple antibiotics.  LOS: 5 days    Jill Bird A 11/02/2012, 7:10 AM

## 2012-11-03 DIAGNOSIS — N739 Female pelvic inflammatory disease, unspecified: Principal | ICD-10-CM | POA: Diagnosis present

## 2012-11-03 MED ORDER — METRONIDAZOLE 500 MG PO TABS: 500.0000 mg | ORAL_TABLET | Freq: Two times a day (BID) | ORAL | Status: DC

## 2012-11-03 MED ORDER — DOXYCYCLINE HYCLATE 100 MG PO CAPS: 100.0000 mg | ORAL_CAPSULE | Freq: Two times a day (BID) | ORAL | Status: DC

## 2012-11-03 MED ORDER — HYDROMORPHONE HCL 4 MG PO TABS: 4.0000 mg | ORAL_TABLET | ORAL | Status: DC | PRN

## 2012-11-03 NOTE — Progress Notes (Signed)
Pt is discharged in the care of husband Downstairs per wheelchair. Stable. Denies any pain or discomfort. Denies vaginal bleeding or discharge. No temp. Understands all discharge instructions well. Questions were asked and answered.

## 2012-11-03 NOTE — Progress Notes (Signed)
Subjective: Patient reports less pain.   Objective: I have reviewed patient's vital signs, intake and output, medications, labs and microbiology.  General: alert and no distress Resp: clear to auscultation bilaterally Cardio: regular rate and rhythm, S1, S2 normal, no murmur, click, rub or gallop GI: abnormal findings:  mild tenderness in the RLQ and in the LUQ Extremities: extremities normal, atraumatic, no cyanosis or edema Vaginal Bleeding: none   Assessment/Plan: Pelvic pain.  Probable PID/TOA.  Improved.  Influenza A.  Stable.  Discharge home on 10 days Doxy/Flagyl and Tamiflu respectively.  F/U in office 2 weeks.  LOS: 6 days    Seferina Brokaw A 11/03/2012, 10:06 AM

## 2012-11-03 NOTE — Progress Notes (Signed)
Pharmacy  Gentamicin Per Pharmacy  S/O: 46 yo with PID/TOA.  Day 3 on ampicillin, gentamicin, and clindamycin.  A:  Drew gentamicin levels. Gent trough 0.9 at 1030, peak 4.1 at 1359. SCr lower today=0.82.  Gent extrapolated trough= 0.8, Gent extrapolated peak= 7.  Pt had fever 12/19 at 1800- 101.5 F. Afebrile otherwise.  Per Dr. Clearance Coots, keeping IV abx through weekend and planning to D/C home on Monday.   P:  Increased gentamicin dose to 150mg  Q12 hours to start 12/20 at 2300 because wanted higher peak since had fever last night. Estimated peak with new dose 7.9, estimated trough still <1.   Will continue to monitor closely.    Thank you,   Smiley Houseman, PharmD

## 2012-11-03 NOTE — Discharge Summary (Signed)
Physician Discharge Summary  Patient ID: Jill Bird MRN: [id number] DOB/AGE: [birth year] 46 y.o.  Admit date: 10/28/2012 Discharge date: 11/03/2012  Admission Diagnoses: Active Problems:  Unspecified inflammatory disease of female pelvic organs and tissues   Discharge Diagnoses:  Active Problems:  Unspecified inflammatory disease of female pelvic organs and tissues   Discharged Condition: stable  Hospital Course: The patient was admitted and started on broad spectrum, parenteral antibiotics.  Imaging was worrisome for bilateral tubo-ovarian abscesses.  Her symptoms gradually improved.  Consults: None  Significant Diagnostic Studies: radiology: CT scan: see above and Ultrasound: see above  Treatments: antibiotics: gentamycin and clindamycin  Discharge Exam: Blood pressure 135/83, pulse 78, temperature 98.7 F (37.1 C), temperature source Oral, resp. rate 20, height 5\' 5"  (1.651 m), weight 85.276 kg (188 lb), last menstrual period 10/07/2012, SpO2 98.00%. General appearance: alert GI: soft, non-tender; bowel sounds normal; no masses,  no organomegaly Extremities: extremities normal, atraumatic, no cyanosis or edema  Disposition: 01-Home or Self Care  Discharge Orders    Future Orders Please Complete By Expires   Diet - low sodium heart healthy      Activity as tolerated - No restrictions      Driving Restrictions      Comments:   Avoid driving while taking narcotics.   Sexual Activity Restrictions      Comments:   No intercourse for 6  weeks   Call MD for:  temperature >100.4      Call MD for:  persistant nausea and vomiting      Call MD for:  severe uncontrolled pain      Call MD for:  persistant dizziness or light-headedness      Call MD for:  extreme fatigue          Medication List     As of 11/03/2012 10:11 AM    STOP taking these medications         HYDROcodone-acetaminophen 5-500 MG per tablet   Commonly known as: VICODIN      TAKE these  medications         doxycycline 100 MG capsule   Commonly known as: VIBRAMYCIN   Take 1 capsule (100 mg total) by mouth 2 (two) times daily. Take for 10 days.      HYDROmorphone 4 MG tablet   Commonly known as: DILAUDID   Take 1 tablet (4 mg total) by mouth every 4 (four) hours as needed.      ibuprofen 200 MG tablet   Commonly known as: ADVIL,MOTRIN   Take 200 mg by mouth daily as needed. For pain      levothyroxine 125 MCG tablet   Commonly known as: SYNTHROID, LEVOTHROID   Take 125 mcg by mouth daily. Take with for a dose of .      levothyroxine 100 MCG tablet   Commonly known as: SYNTHROID, LEVOTHROID   Take 100 mcg by mouth daily. Takes with tablet to make a dose.      metroNIDAZOLE 500 MG tablet   Commonly known as: FLAGYL   Take 1 tablet (500 mg total) by mouth 2 (two) times daily.           Follow-up Information    Follow up with HARPER,CHARLES A, MD. Schedule an appointment as soon as possible for a visit in 2 weeks.   Contact information:   [address] [place] [address] [phone]  SignedAntionette Char A 11/03/2012, 10:11 AM

## 2012-11-07 LAB — CULTURE, BLOOD (ROUTINE X 2): Culture: NO GROWTH

## 2013-03-02 ENCOUNTER — Ambulatory Visit: Payer: Self-pay | Admitting: Obstetrics

## 2013-03-05 ENCOUNTER — Encounter: Payer: Self-pay | Admitting: Obstetrics

## 2013-03-05 ENCOUNTER — Ambulatory Visit (INDEPENDENT_AMBULATORY_CARE_PROVIDER_SITE_OTHER): Admitting: Obstetrics

## 2013-03-05 VITALS — BP 142/87 | HR 77 | Temp 98.5°F | Ht 65.0 in | Wt 193.6 lb

## 2013-03-05 DIAGNOSIS — N739 Female pelvic inflammatory disease, unspecified: Secondary | ICD-10-CM

## 2013-03-05 MED ORDER — METRONIDAZOLE 500 MG PO TABS: 500.0000 mg | ORAL_TABLET | Freq: Two times a day (BID) | ORAL | Status: AC

## 2013-03-05 MED ORDER — METRONIDAZOLE 500 MG PO TABS: 500.0000 mg | ORAL_TABLET | Freq: Two times a day (BID) | ORAL | Status: DC

## 2013-03-05 MED ORDER — DOXYCYCLINE HYCLATE 100 MG PO CAPS: 100.0000 mg | ORAL_CAPSULE | Freq: Two times a day (BID) | ORAL | Status: AC

## 2013-03-05 MED ORDER — DOXYCYCLINE HYCLATE 100 MG PO CAPS: 100.0000 mg | ORAL_CAPSULE | Freq: Two times a day (BID) | ORAL | Status: DC

## 2013-03-05 NOTE — Patient Instructions (Signed)
PID

## 2013-03-05 NOTE — Progress Notes (Signed)
.   Subjective:     Jill Bird is a 47 y.o. female here for a follow up and pelvic ultrasound .  Current complaints - very heavy cycles.  Personal health questionnaire reviewed: yes.   Gynecologic History Patient's last menstrual period was 02/15/2013. Contraception: none Last Pap:  09/2012 - normal Last mammogram: 02/2013 - normal  Obstetric History OB History   Grav Para Term Preterm Abortions TAB SAB Ect Mult Living   1 1  1     1 2      # Outc Date GA Lbr Len/2nd Wgt Sex Del Anes PTL Lv   1 PRE                The following portions of the patient's history were reviewed and updated as appropriate: allergies, current medications, past family history, past medical history, past social history, past surgical history and problem list.  Review of Systems Pertinent items are noted in HPI.    Objective:    No exam performed today, Consult only.    Assessment:    PID.  Resolving.  Ultrasound improved. 10 day course of Doxy/Flagyl Rx.   Plan:    Education reviewed: safe sex/STD prevention and PID. Follow up in: 2 weeks.  2 months.

## 2013-03-06 ENCOUNTER — Encounter: Payer: Self-pay | Admitting: Obstetrics

## 2013-03-17 IMAGING — US US PELVIS COMPLETE
1 series · 13 of 25 positions shown · non-contrast
Comparison: CT on 01/24/2010

CLINICAL DATA: Pelvic pain.  Pelvic inflammatory disease.
Leukocytosis.



[Series 1: us pelvis complete · 13 of 61 slices shown]
[im 1/61]
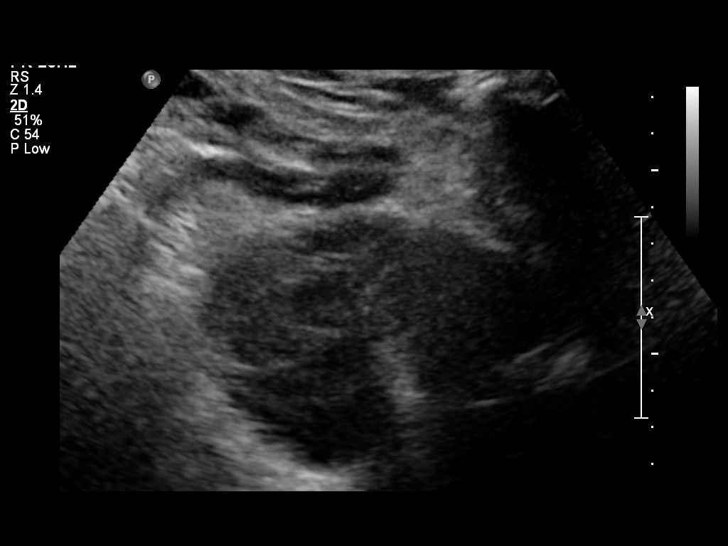
[im 6/61]
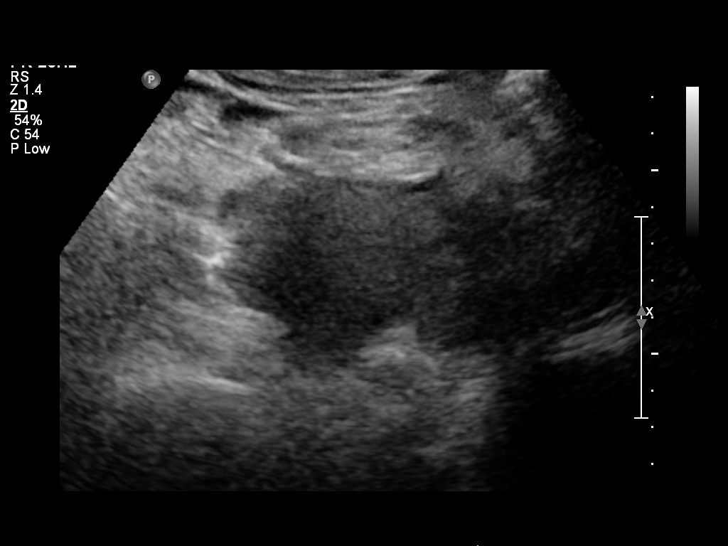
[im 11/61]
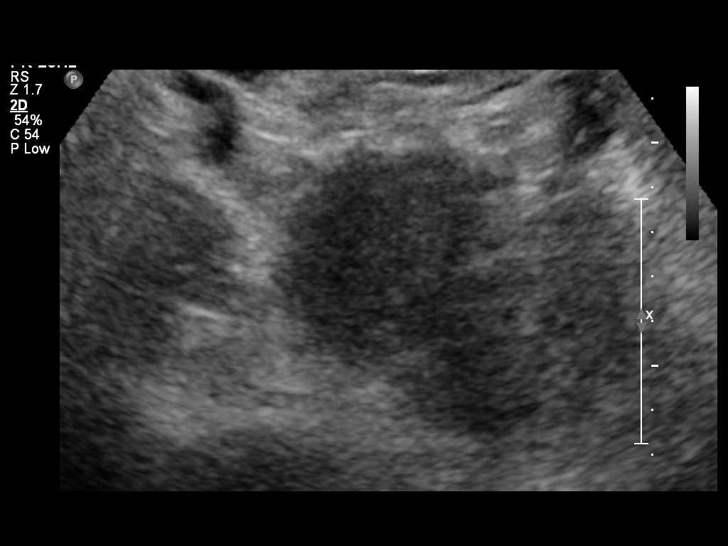
[im 16/61]
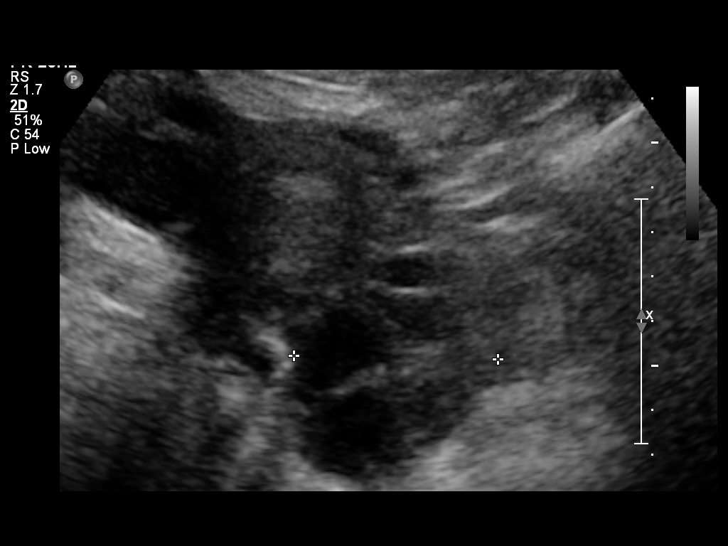
[im 21/61]
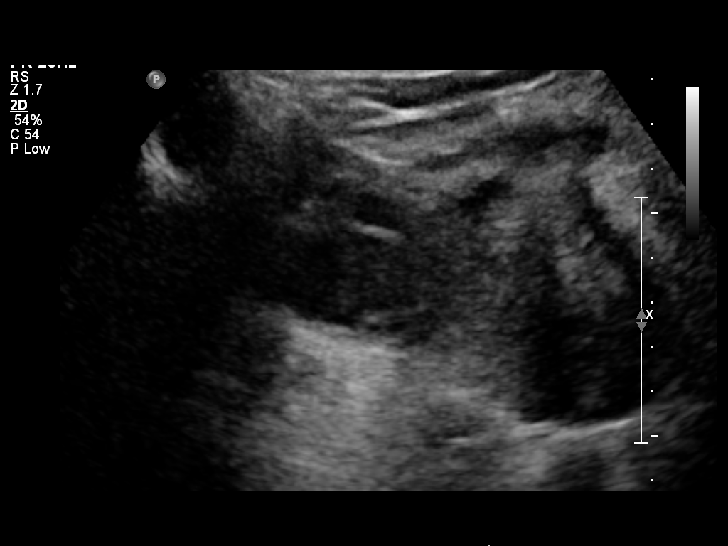
[im 26/61]
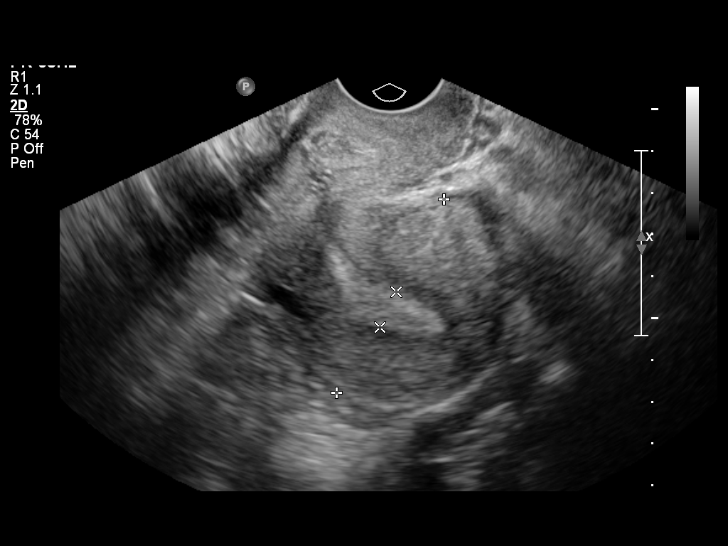
[im 31/61]
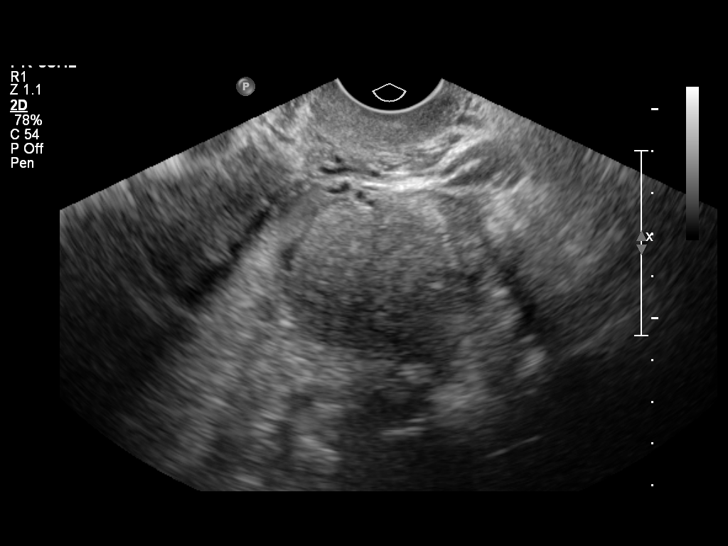
[im 36/61]
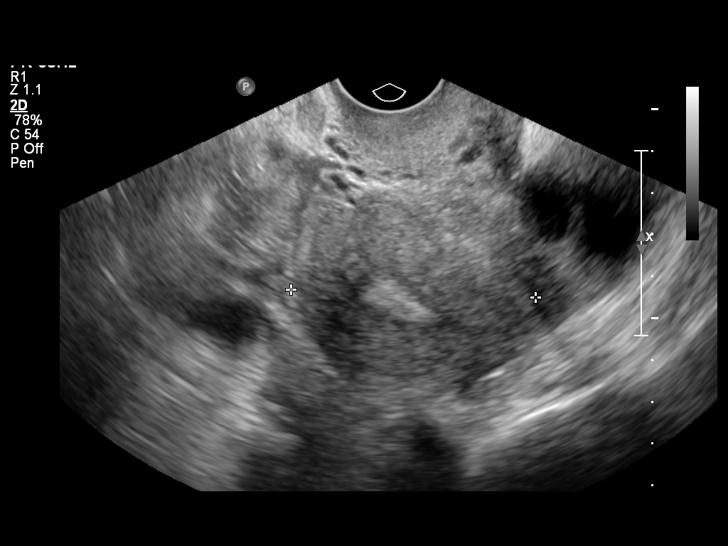
[im 41/61]
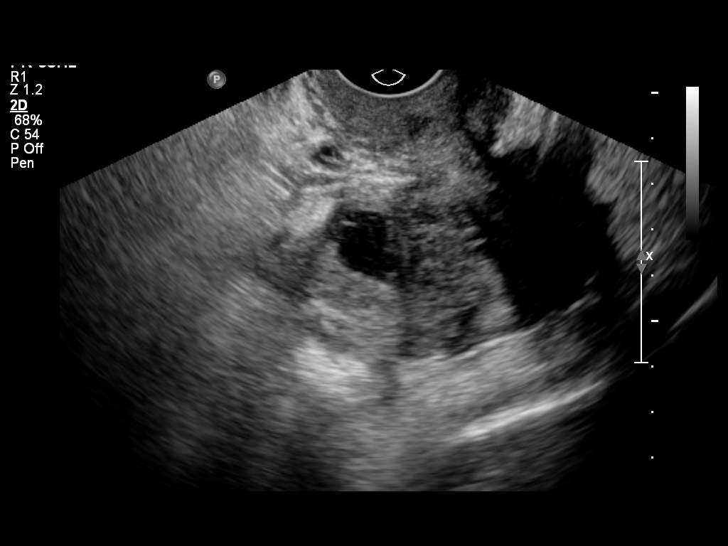
[im 46/61]
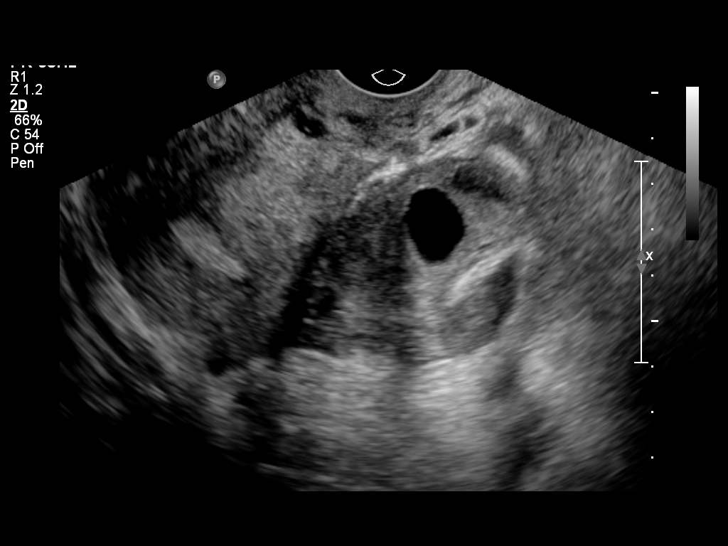
[im 51/61]
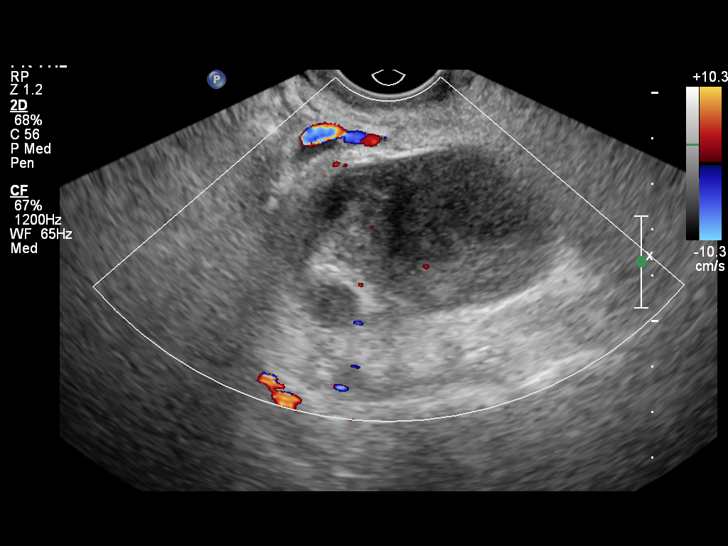
[im 56/61]
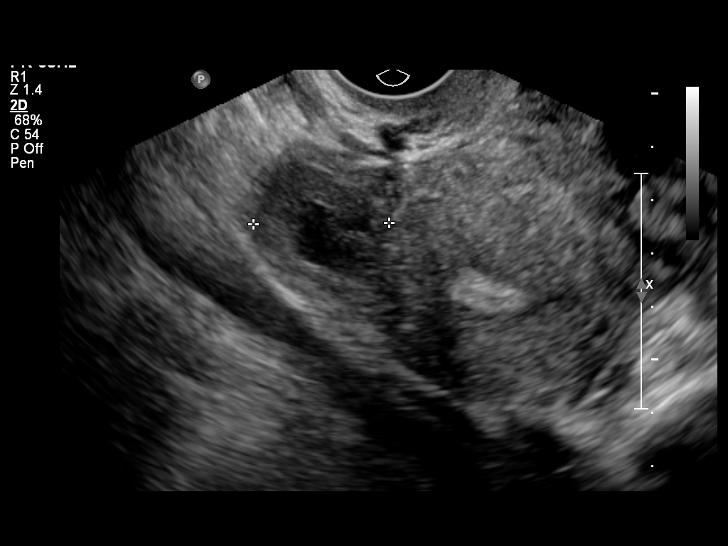
[im 61/61]
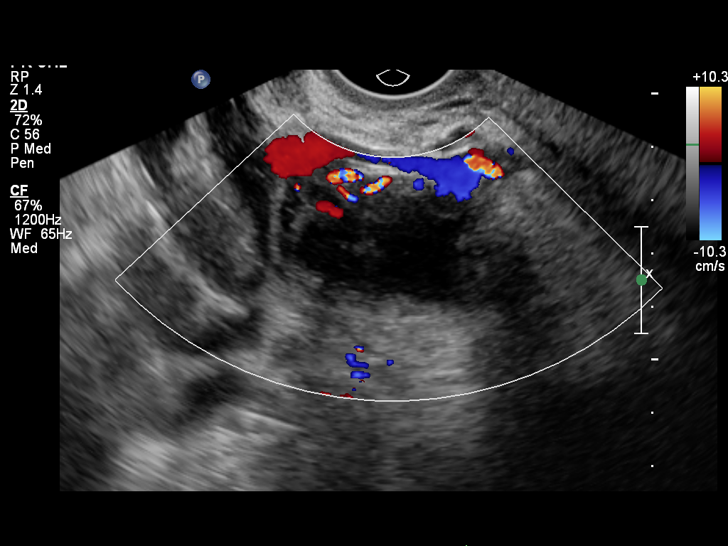

[13 of 25 positions shown; findings below may reference images not displayed]

FINDINGS: Uterus:  10.7 x 5.3 x 5.9 cm.  Retroflexed.  No fibroids or other
uterine mass identified.

Endometrium: Double layer thickness measures 9 mm transvaginally.
No focal lesion visualized.

Right ovary: Not well visualized.  A small complex cystic lesion is
seen which measures approximately 2.4 x 1.5 x 1.7 cm.  This shows
no central blood flow on color Doppler ultrasound.  Peripheral
blood flow is demonstrated.

Left ovary: Not well visualized.  A complex cystic lesion with
thick septations is seen which measures 3.9 x 3.2 x 2.6 cm.  This
shows no evidence of central blood flow on color Doppler ultrasound
although peripheral blood flow is seen.

Other Findings:  No free fluid
IMPRESSION: 1. Small bilateral indeterminate complex cystic adnexal masses.
Differential diagnosis includes bilateral tubal ovarian abscesses
and endometriosis.  Less likely considerations include hemorrhagic
ovarian cysts or cystic ovarian neoplasms.  Recommend follow-up by
pelvic ultrasound in 6-12 weeks. This recommendation follows the
consensus statement:  Management of Asymptomatic Ovarian and Other
Adnexal Cysts Imaged at US:  Society of [employer] in Ultrasound
2.  Normal appearance of the uterus.  No evidence of free fluid.

## 2013-04-06 ENCOUNTER — Emergency Department (HOSPITAL_COMMUNITY)
Admission: EM | Admit: 2013-04-06 | Discharge: 2013-04-06 | Disposition: A | Discharge status: Home / Self Care | Attending: Emergency Medicine | Admitting: Emergency Medicine

## 2013-04-06 ENCOUNTER — Emergency Department (HOSPITAL_COMMUNITY)

## 2013-04-06 DIAGNOSIS — E079 Disorder of thyroid, unspecified: Secondary | ICD-10-CM | POA: Insufficient documentation

## 2013-04-06 DIAGNOSIS — N7093 Salpingitis and oophoritis, unspecified: Secondary | ICD-10-CM | POA: Insufficient documentation

## 2013-04-06 DIAGNOSIS — N898 Other specified noninflammatory disorders of vagina: Secondary | ICD-10-CM | POA: Insufficient documentation

## 2013-04-06 DIAGNOSIS — Z9089 Acquired absence of other organs: Secondary | ICD-10-CM | POA: Insufficient documentation

## 2013-04-06 DIAGNOSIS — Z8719 Personal history of other diseases of the digestive system: Secondary | ICD-10-CM | POA: Insufficient documentation

## 2013-04-06 DIAGNOSIS — R197 Diarrhea, unspecified: Secondary | ICD-10-CM | POA: Insufficient documentation

## 2013-04-06 DIAGNOSIS — R509 Fever, unspecified: Secondary | ICD-10-CM | POA: Insufficient documentation

## 2013-04-06 DIAGNOSIS — N739 Female pelvic inflammatory disease, unspecified: Secondary | ICD-10-CM

## 2013-04-06 DIAGNOSIS — E876 Hypokalemia: Secondary | ICD-10-CM | POA: Insufficient documentation

## 2013-04-06 DIAGNOSIS — Z79899 Other long term (current) drug therapy: Secondary | ICD-10-CM | POA: Insufficient documentation

## 2013-04-06 DIAGNOSIS — Z3202 Encounter for pregnancy test, result negative: Secondary | ICD-10-CM | POA: Insufficient documentation

## 2013-04-06 DIAGNOSIS — R112 Nausea with vomiting, unspecified: Secondary | ICD-10-CM | POA: Insufficient documentation

## 2013-04-06 DIAGNOSIS — Z8619 Personal history of other infectious and parasitic diseases: Secondary | ICD-10-CM | POA: Insufficient documentation

## 2013-04-06 LAB — CBC WITH DIFFERENTIAL/PLATELET
Basophils Absolute: 0 10*3/uL (ref 0.0–0.1)
Basophils Relative: 0 % (ref 0–1)
Eosinophils Absolute: 0.2 10*3/uL (ref 0.0–0.7)
Eosinophils Relative: 2 % (ref 0–5)
HCT: 36.5 % (ref 36.0–46.0)
MCHC: 33.7 g/dL (ref 30.0–36.0)
MCV: 79.7 fL (ref 78.0–100.0)
Monocytes Absolute: 1 10*3/uL (ref 0.1–1.0)
Platelets: 258 10*3/uL (ref 150–400)
RDW: 13.7 % (ref 11.5–15.5)
WBC: 10.8 10*3/uL — ABNORMAL HIGH (ref 4.0–10.5)

## 2013-04-06 LAB — PREGNANCY, URINE: Preg Test, Ur: NEGATIVE

## 2013-04-06 LAB — URINALYSIS, ROUTINE W REFLEX MICROSCOPIC
Glucose, UA: NEGATIVE mg/dL
Specific Gravity, Urine: 1.022 (ref 1.005–1.030)
Urobilinogen, UA: 0.2 mg/dL (ref 0.0–1.0)
pH: 5.5 (ref 5.0–8.0)

## 2013-04-06 LAB — URINE MICROSCOPIC-ADD ON

## 2013-04-06 LAB — RPR: RPR Ser Ql: NONREACTIVE

## 2013-04-06 LAB — WET PREP, GENITAL: Yeast Wet Prep HPF POC: NONE SEEN

## 2013-04-06 LAB — COMPREHENSIVE METABOLIC PANEL
ALT: 12 U/L (ref 0–35)
AST: 12 U/L (ref 0–37)
Albumin: 3.2 g/dL — ABNORMAL LOW (ref 3.5–5.2)
Calcium: 9.5 mg/dL (ref 8.4–10.5)
Creatinine, Ser: 0.82 mg/dL (ref 0.50–1.10)
GFR calc non Af Amer: 85 mL/min — ABNORMAL LOW (ref >90)
Sodium: 137 mEq/L (ref 135–145)
Total Protein: 8.3 g/dL (ref 6.0–8.3)

## 2013-04-06 MED ORDER — IOHEXOL 300 MG/ML  SOLN
25.0000 mL | INTRAMUSCULAR | Status: AC
  Administered 2013-04-06 (×2): 25 mL via ORAL

## 2013-04-06 MED ORDER — IOHEXOL 300 MG/ML  SOLN
100.0000 mL | Freq: Once | INTRAMUSCULAR | Status: AC | PRN
  Administered 2013-04-06: 100 mL via INTRAVENOUS

## 2013-04-06 MED ORDER — SODIUM CHLORIDE 0.9 % IV SOLN
1000.0000 mL | INTRAVENOUS | Status: DC
  Administered 2013-04-06: 1000 mL via INTRAVENOUS

## 2013-04-06 MED ORDER — POTASSIUM CHLORIDE CRYS ER 20 MEQ PO TBCR
40.0000 meq | EXTENDED_RELEASE_TABLET | Freq: Once | ORAL | Status: AC
  Administered 2013-04-06: 40 meq via ORAL
  Filled 2013-04-06: qty 2

## 2013-04-06 MED ORDER — SODIUM CHLORIDE 0.9 % IV SOLN
1000.0000 mL | Freq: Once | INTRAVENOUS | Status: AC
  Administered 2013-04-06: 1000 mL via INTRAVENOUS

## 2013-04-06 MED ORDER — ONDANSETRON HCL 4 MG/2ML IJ SOLN
4.0000 mg | Freq: Once | INTRAMUSCULAR | Status: AC
  Administered 2013-04-06: 4 mg via INTRAVENOUS
  Filled 2013-04-06: qty 2

## 2013-04-06 MED ORDER — HYDROMORPHONE HCL PF 1 MG/ML IJ SOLN
1.0000 mg | Freq: Once | INTRAMUSCULAR | Status: AC
  Administered 2013-04-06: 1 mg via INTRAVENOUS
  Filled 2013-04-06: qty 1

## 2013-04-06 NOTE — ED Notes (Signed)
Pt c/o pelvic inflammatory desease flare up, with pain in all quadrants, N/V/D, fever and chills.

## 2013-04-06 NOTE — ED Provider Notes (Signed)
History     CSN: [id number]  Arrival date & time 04/06/13  0621   First MD Initiated Contact with Patient 04/06/13 [phone]      Chief Complaint  Patient presents with  . Abdominal Pain    (Consider location/radiation/quality/duration/timing/severity/associated sxs/prior treatment) Patient is a 47 y.o. female presenting with abdominal pain. The history is provided by the patient.  Abdominal Pain Associated symptoms include abdominal pain.  She has been undergoing treatment for pelvic inflammatory disease since last December and had been doing reasonably well until 4 days ago when she had recurrence of abdominal pain and fevers. Fevers have been as high as 104. There is associated nausea, vomiting, diarrhea. She has had some periods vaginal discharge. She denies urinary urgency, frequency, tenesmus, dysuria. She's been able to drink only small amount of fluid during this time and has not eaten. Pain is worse with bending and with staying in one position for an extended period of time. Nothing makes it better. She had been taking pain medication but has run out. She rates pain at 10/10. Last pelvic ultrasound was done in March.  Past Medical History  Diagnosis Date  . Thyroid disease   . Hidradenitis     in L groin required surgery  . Constipation     Past Surgical History  Procedure Laterality Date  . Appendectomy    . Cesarean section    . Breast lumpectomy      benign    No family history on file.  History  Substance Use Topics  . Smoking status: Never Smoker   . Smokeless tobacco: Never Used  . Alcohol Use: No    OB History   Grav Para Term Preterm Abortions TAB SAB Ect Mult Living   1 1  1     1 2       Review of Systems  Gastrointestinal: Positive for abdominal pain.  All other systems reviewed and are negative.    Allergies  Review of patient's allergies indicates no known allergies.  Home Medications   Current Outpatient Rx  Name  Route  Sig  Dispense   Refill  . doxycycline (VIBRAMYCIN) 100 MG capsule   Oral   Take 1 capsule (100 mg total) by mouth 2 (two) times daily. Take for 10 days.   20 capsule   0   . HYDROmorphone (DILAUDID) 4 MG tablet   Oral   Take 1 tablet (4 mg total) by mouth every 4 (four) hours as needed.   30 tablet   0   . ibuprofen (ADVIL,MOTRIN) 200 MG tablet   Oral   Take 200 mg by mouth daily as needed. For pain         . levothyroxine (SYNTHROID, LEVOTHROID) 100 MCG tablet   Oral   Take 100 mcg by mouth daily. Takes with tablet to make a dose.         . levothyroxine (SYNTHROID, LEVOTHROID) 125 MCG tablet   Oral   Take 125 mcg by mouth daily. Take with for a dose of .         . metroNIDAZOLE (FLAGYL) 500 MG tablet   Oral   Take 1 tablet (500 mg total) by mouth 2 (two) times daily.   20 tablet   0     BP 139/82  Pulse 94  Temp(Src) 99 F (37.2 C) (Oral)  Resp 20  SpO2 98%  Physical Exam  Nursing note and vitals reviewed.  47 year old female,  resting comfortably and in no acute distress. Vital signs are normal. Oxygen saturation is 98%, which is normal. Head is normocephalic and atraumatic. PERRLA, EOMI. Oropharynx is clear. Neck is nontender and supple without adenopathy or JVD. Back is nontender and there is no CVA tenderness. Lungs are clear without rales, wheezes, or rhonchi. Chest is nontender. Heart has regular rate and rhythm without murmur. Abdomen is soft, flat, with diffuse abdominal tenderness. There is no rebound or guarding. Tenderness is worse in the suprapubic area and left upper quadrant. There are no masses or hepatosplenomegaly and peristalsis is hypoactive. Extremities have no cyanosis or edema, full range of motion is present. Skin is warm and dry without rash. Neurologic: Mental status is normal, cranial nerves are intact, there are no motor or sensory deficits.  ED Course  Procedures (including critical care time)  Results for orders  placed during the hospital encounter of 04/06/13  WET PREP, GENITAL      Result Value Range   Yeast Wet Prep HPF POC NONE SEEN  NONE SEEN   Trich, Wet Prep NONE SEEN  NONE SEEN   Clue Cells Wet Prep HPF POC NONE SEEN  NONE SEEN   WBC, Wet Prep HPF POC TOO NUMEROUS TO COUNT (*) NONE SEEN  CBC WITH DIFFERENTIAL      Result Value Range   WBC 10.8 (*) 4.0 - 10.5 K/uL   RBC 4.58  3.87 - 5.11 MIL/uL   Hemoglobin 12.3  12.0 - 15.0 g/dL   HCT 78.4  69.6 - 29.5 %   MCV 79.7  78.0 - 100.0 fL   MCH 26.9  26.0 - 34.0 pg   MCHC 33.7  30.0 - 36.0 g/dL   RDW 28.4  13.2 - 44.0 %   Platelets 258  150 - 400 K/uL   Neutrophils Relative % 67  43 - 77 %   Neutro Abs 7.3  1.7 - 7.7 K/uL   Lymphocytes Relative 22  12 - 46 %   Lymphs Abs 2.3  0.7 - 4.0 K/uL   Monocytes Relative 9  3 - 12 %   Monocytes Absolute 1.0  0.1 - 1.0 K/uL   Eosinophils Relative 2  0 - 5 %   Eosinophils Absolute 0.2  0.0 - 0.7 K/uL   Basophils Relative 0  0 - 1 %   Basophils Absolute 0.0  0.0 - 0.1 K/uL  COMPREHENSIVE METABOLIC PANEL      Result Value Range   Sodium 137  135 - 145 mEq/L   Potassium 3.1 (*) 3.5 - 5.1 mEq/L   Chloride 97  96 - 112 mEq/L   CO2 26  19 - 32 mEq/L   Glucose, Bld 102 (*) 70 - 99 mg/dL   BUN 11  6 - 23 mg/dL   Creatinine, Ser 1.02  0.50 - 1.10 mg/dL   Calcium 9.5  8.4 - 72.5 mg/dL   Total Protein 8.3  6.0 - 8.3 g/dL   Albumin 3.2 (*) 3.5 - 5.2 g/dL   AST 12  0 - 37 U/L   ALT 12  0 - 35 U/L   Alkaline Phosphatase 68  39 - 117 U/L   Total Bilirubin 0.4  0.3 - 1.2 mg/dL   GFR calc non Af Amer 85 (*) >90 mL/min   GFR calc Af Amer >90  >90 mL/min  URINALYSIS, ROUTINE W REFLEX MICROSCOPIC      Result Value Range   Color, Urine AMBER (*) YELLOW   APPearance CLOUDY (*)  CLEAR   Specific Gravity, Urine 1.022  1.005 - 1.030   pH 5.5  5.0 - 8.0   Glucose, UA NEGATIVE  NEGATIVE mg/dL   Hgb urine dipstick LARGE (*) NEGATIVE   Bilirubin Urine NEGATIVE  NEGATIVE   Ketones, ur NEGATIVE  NEGATIVE mg/dL    Protein, ur 30 (*) NEGATIVE mg/dL   Urobilinogen, UA 0.2  0.0 - 1.0 mg/dL   Nitrite NEGATIVE  NEGATIVE   Leukocytes, UA TRACE (*) NEGATIVE  URINE MICROSCOPIC-ADD ON      Result Value Range   Squamous Epithelial / LPF RARE  RARE   WBC, UA 3-6  <3 WBC/hpf   RBC / HPF 21-50  <3 RBC/hpf   Bacteria, UA FEW (*) RARE   Casts HYALINE CASTS (*) NEGATIVE   Urine-Other MUCOUS PRESENT    PREGNANCY, URINE      Result Value Range   Preg Test, Ur NEGATIVE  NEGATIVE   Ct Abdomen Pelvis W Contrast  04/06/2013   *RADIOLOGY REPORT*  Clinical Data: Abdominal pain.  Pelvic inflammatory disease.  CT ABDOMEN AND PELVIS WITH CONTRAST  Technique:  Multidetector CT imaging of the abdomen and pelvis was performed following the standard protocol during bolus administration of intravenous contrast.  Contrast: OMNIPAQUE IOHEXOL 300 MG/ML  SOLN  Comparison: 01/24/2010  Findings: There is either pleural thickening or a small amount of pleural fluid bilaterally.  There is a tiny amount of pericardial fluid.  The liver has a normal appearance.  No calcified gallstones.  The spleen is normal.  The pancreas is normal.  The adrenal glands are normal.  The right kidney is normal.  The left kidney shows hydroureteronephrosis with the ureter being dilated to the mid pelvis.  The aorta and IVC are normal.  No bowel pathology seen in the abdominal portion of the scan.  There are indistinct mixed density regions in both adnexal regions, more extensive on the left than the right, consistent with complicated pelvic inflammatory disease at least on the left. There are probably tubo-ovarian abscesses measuring in conglomeration 7 cm.  This is responsible for the left sided ureteral relative obstruction.  No free fluid in the pelvis.  IMPRESSION: Advanced pelvic inflammatory disease, more extensive on the left than the right.  Tubo-ovarian abscesses certainly present on the left measuring in conglomeration approximately 7 cm.  This  results in relative obstruction of the left ureter with moderate hydroureteronephrosis on the left and delayed excretion of contrast.   Original Report Authenticated By: Paulina Fusi, M.D.      1. Pelvic inflammatory disease   2. Left tubo-ovarian abscess   3. Hypokalemia       MDM  Abdominal pain with history of difficult to control pelvic inflammatory disease. Her records are reviewed and she had been hospitalized in December with possible tubo-ovarian abscess and was seen in followup in April 01 and was felt to have ongoing symptoms from pelvic inflammatory disease and was continued on antibiotics. Pelvic exam will need to be done and some sort of imaging will be needed. The ultrasound from March is not available in EPIC. Decision about CT versus ultrasound will be made after exam is done. Also, note is made of hypokalemia. Patient has had hypokalemia every time it has been checked in our system. She'll be given a dose of oral potassium. Pain will be managed with hydromorphone and nausea managed with ondansetron.  Pelvic exam shows normal external female genitalia. There is a small amount of white discharge present  cervix appears normal. Fundus is 6-8 weeks size and nontender. There's no cervical motion tenderness. There is no right adnexal tenderness but there is moderate left adnexal tenderness. No adnexal masses palpated. Palpation of the left adnexa does not reproduce her pain. Therefore, a CT of the abdomen and pelvis will be obtained to evaluate her pain.  CT has come back consistent with severe PID with multiple tubo-ovarian abscess he is in the left with aggregate size of 7 cm. Have discussed the case with her gynecologist, Dr. Clearance Coots including CT results of lab results and he has requested that the patient be transferred to [facility] as a complicated PID that he did not feel comfortable managing here. Case is been discussed with Dr. Jeanella Craze of the GYN service at [facility] was  agreed to accept the patient in transfer.  Dione Booze, MD 04/06/13 1226

## 2013-04-06 NOTE — ED Notes (Signed)
MD at bedside. 

## 2013-04-06 NOTE — ED Notes (Signed)
LMP: 5/10-5/15/2014

## 2013-04-06 NOTE — ED Notes (Signed)
[name], AD- pt can transfer with saline lock IV. Dr. Preston Fleeting states that he is ok with this as well. Pt husband to drive her there. [name] given report at [facility]. Pt informed to go to admitting upon arrival at [facility]. CT disk sent with pt.

## 2013-04-07 LAB — GC/CHLAMYDIA PROBE AMP
CT Probe RNA: NEGATIVE
GC Probe RNA: NEGATIVE

## 2013-04-07 LAB — URINE CULTURE

## 2013-05-07 ENCOUNTER — Ambulatory Visit: Admitting: Obstetrics

## 2013-07-01 ENCOUNTER — Encounter: Payer: Self-pay | Admitting: Obstetrics

## 2013-08-24 IMAGING — CT CT ABD-PELV W/ CM
2 of 5 series · 14 of 32 positions shown, 19 images · IV contrast (water/omni  & 100ml omni 300)
Comparison: 01/24/2010

CLINICAL DATA: Abdominal pain.  Pelvic inflammatory disease.

CT ABDOMEN AND PELVIS WITH CONTRAST
TECHNIQUE: Multidetector CT imaging of the abdomen and pelvis was
performed following the standard protocol during bolus
administration of intravenous contrast.
Contrast: 100mL OMNIPAQUE IOHEXOL 300 MG/ML  SOLN

[Series 2: routine abdomen · axial · 0.73mm/px · z∈[-307,-12]mm · 6 of 83 slices shown, 11 images]
[im 12/83  soft-tissue]
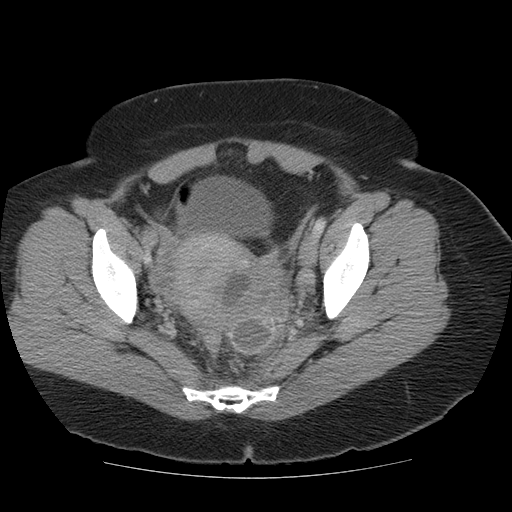
[im 12/83  bone]
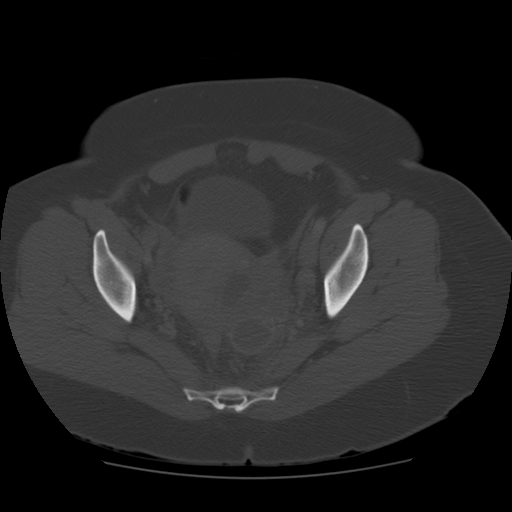
[im 24/83  soft-tissue]
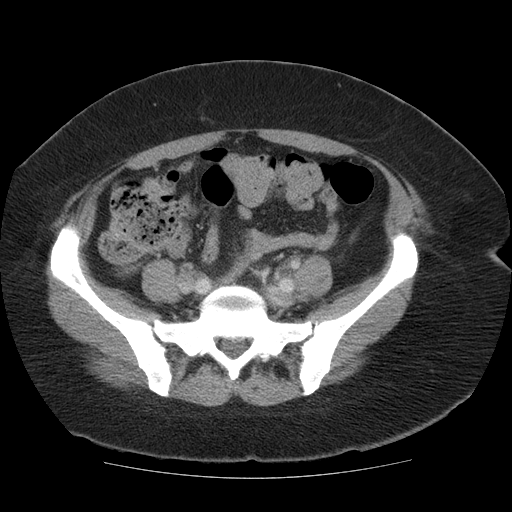
[im 36/83  soft-tissue]
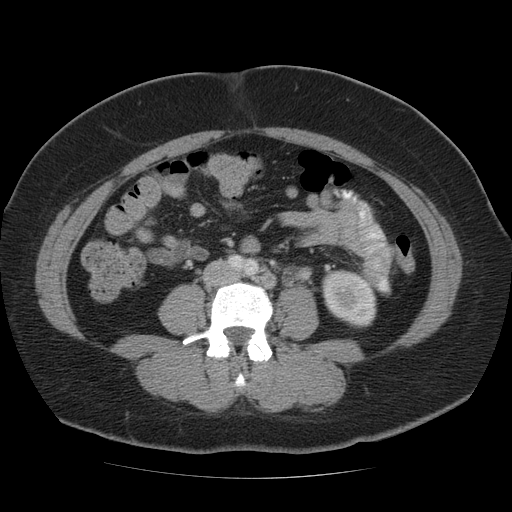
[im 36/83  lung]
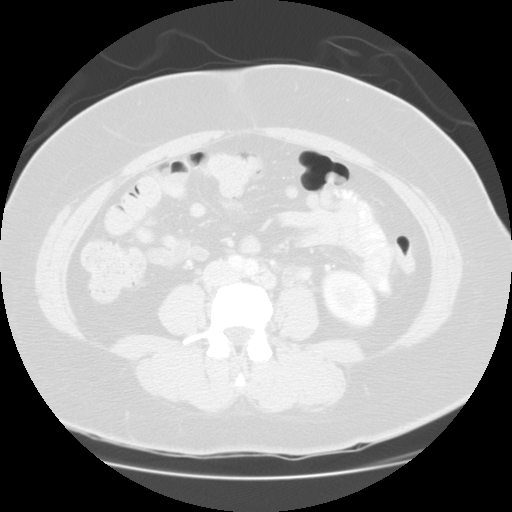
[im 47/83  soft-tissue]
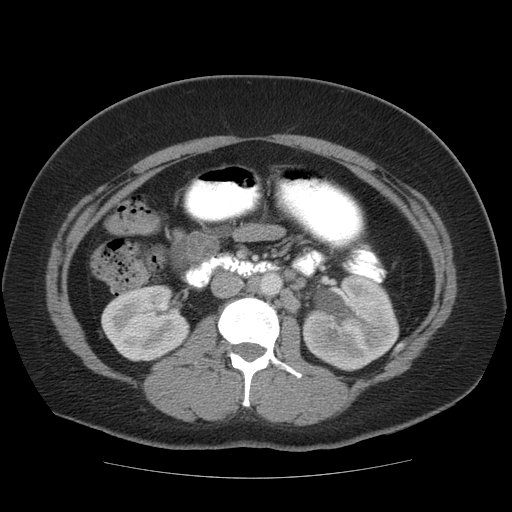
[im 47/83  lung]
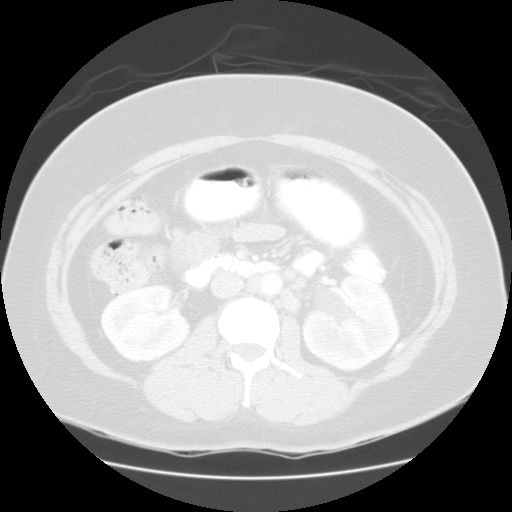
[im 59/83  soft-tissue]
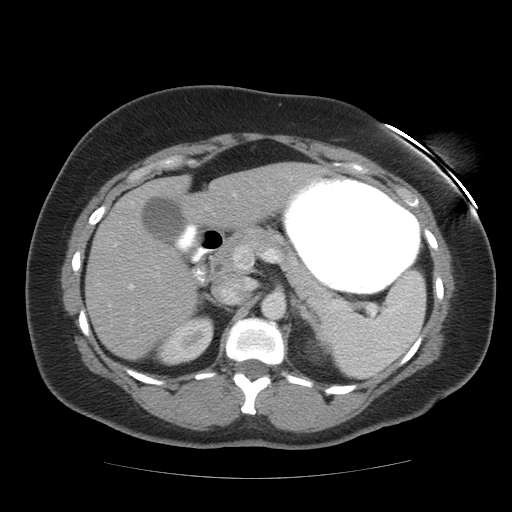
[im 59/83  lung]
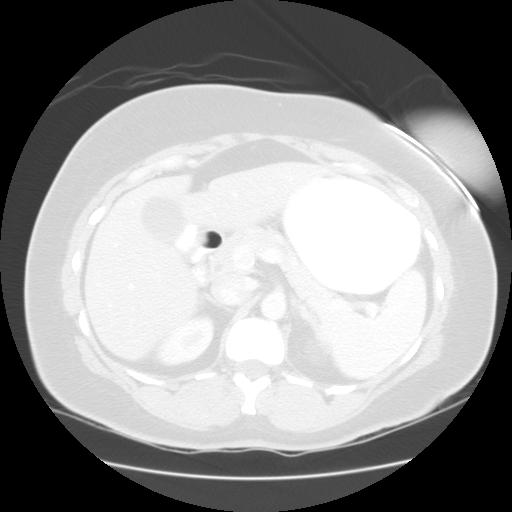
[im 71/83  soft-tissue]
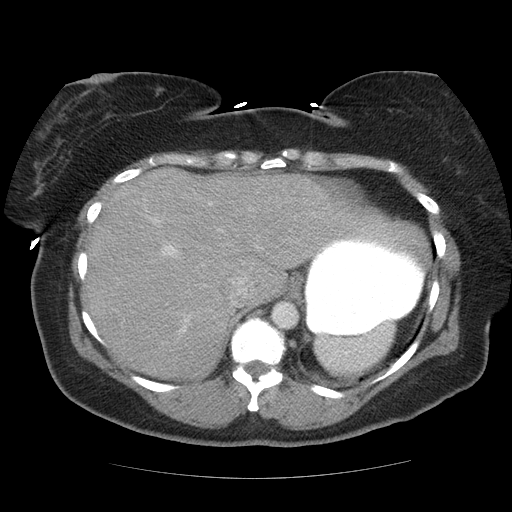
[im 71/83  lung]
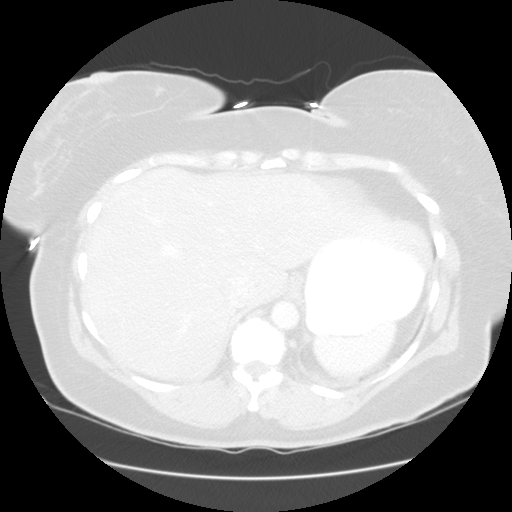

[Series 401: sagittals · sagittal · 0.93mm/px · 8 of 117 slices shown]
[im 12/117  soft-tissue]
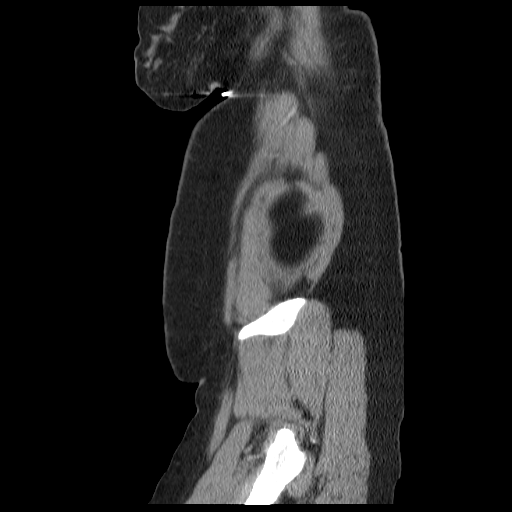
[im 24/117  soft-tissue]
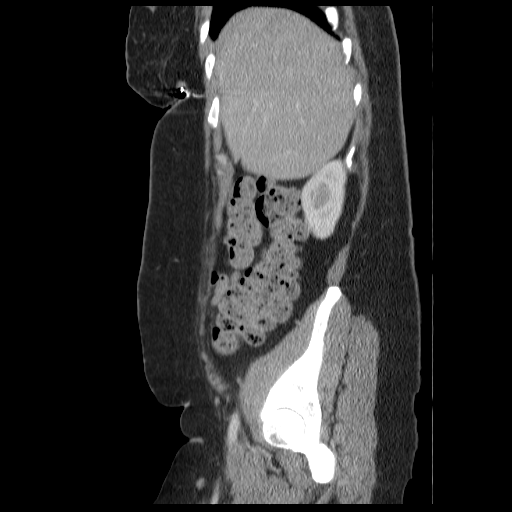
[im 35/117  soft-tissue]
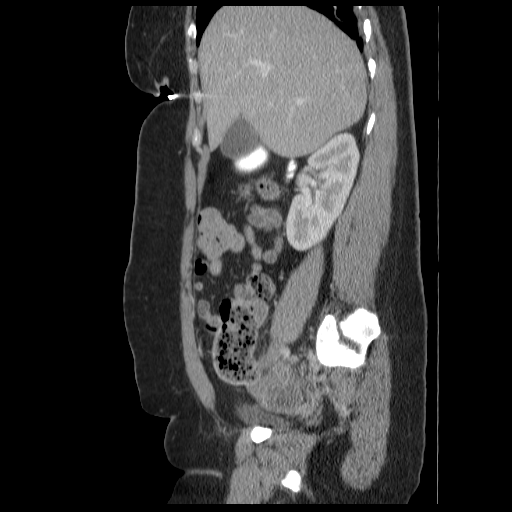
[im 47/117  soft-tissue]
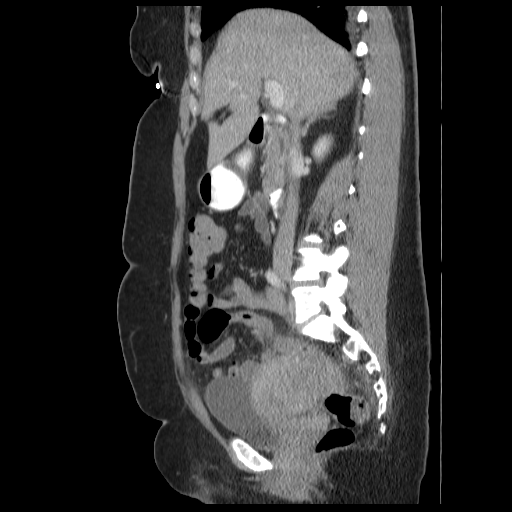
[im 70/117  soft-tissue]
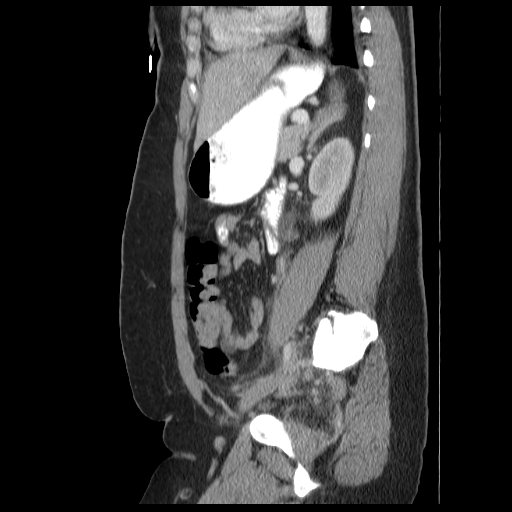
[im 82/117  soft-tissue]
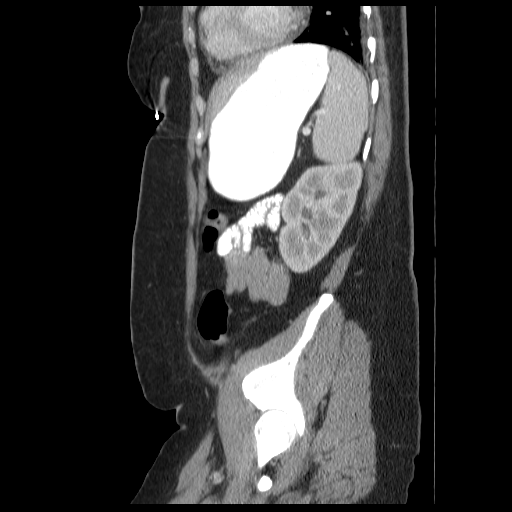
[im 93/117  soft-tissue]
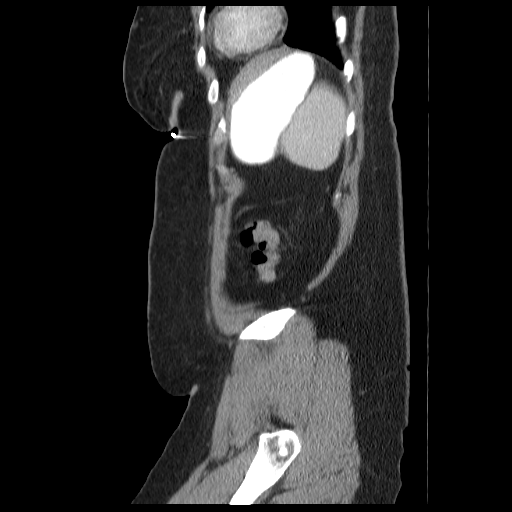
[im 105/117  soft-tissue]
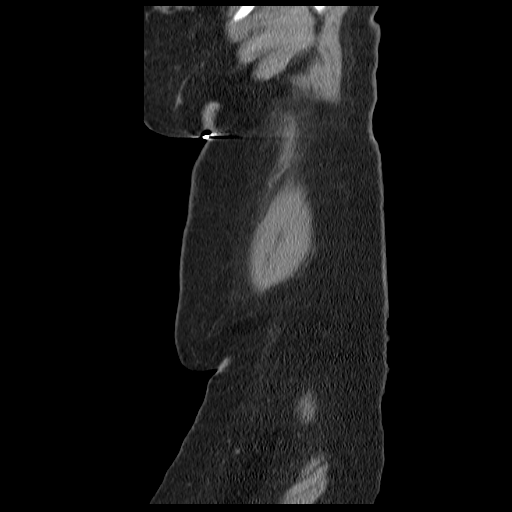

[14 of 32 positions shown; findings below may reference images not displayed]

FINDINGS: There is either pleural thickening or a small amount of
pleural fluid bilaterally.  There is a tiny amount of pericardial
fluid.

The liver has a normal appearance.  No calcified gallstones.  The
spleen is normal.  The pancreas is normal.  The adrenal glands are
normal.  The right kidney is normal.  The left kidney shows
hydroureteronephrosis with the ureter being dilated to the mid
pelvis.  The aorta and IVC are normal.  No bowel pathology seen in
the abdominal portion of the scan.

There are indistinct mixed density regions in both adnexal regions,
more extensive on the left than the right, consistent with
complicated pelvic inflammatory disease at least on the left.
There are probably tubo-ovarian abscesses measuring in
conglomeration 7 cm.  This is responsible for the left sided
ureteral relative obstruction.  No free fluid in the pelvis.
IMPRESSION: Advanced pelvic inflammatory disease, more extensive on the left
than the right.  Tubo-ovarian abscesses certainly present on the
left measuring in conglomeration approximately 7 cm.  This results
in relative obstruction of the left ureter with moderate
hydroureteronephrosis on the left and delayed excretion of
contrast.

## 2014-09-13 ENCOUNTER — Encounter: Payer: Self-pay | Admitting: Obstetrics

## 2016-05-03 ENCOUNTER — Emergency Department (HOSPITAL_COMMUNITY)

## 2016-05-03 ENCOUNTER — Other Ambulatory Visit: Payer: Self-pay

## 2016-05-03 ENCOUNTER — Encounter (HOSPITAL_COMMUNITY): Payer: Self-pay | Admitting: Nurse Practitioner

## 2016-05-03 ENCOUNTER — Emergency Department (HOSPITAL_COMMUNITY)
Admission: EM | Admit: 2016-05-03 | Discharge: 2016-05-03 | Disposition: A | Discharge status: Home / Self Care | Attending: Emergency Medicine | Admitting: Emergency Medicine

## 2016-05-03 DIAGNOSIS — Z791 Long term (current) use of non-steroidal anti-inflammatories (NSAID): Secondary | ICD-10-CM | POA: Diagnosis not present

## 2016-05-03 DIAGNOSIS — R0781 Pleurodynia: Secondary | ICD-10-CM | POA: Diagnosis present

## 2016-05-03 DIAGNOSIS — I10 Essential (primary) hypertension: Secondary | ICD-10-CM | POA: Insufficient documentation

## 2016-05-03 DIAGNOSIS — Z79899 Other long term (current) drug therapy: Secondary | ICD-10-CM | POA: Diagnosis not present

## 2016-05-03 DIAGNOSIS — R0789 Other chest pain: Secondary | ICD-10-CM

## 2016-05-03 HISTORY — DX: Insomnia, unspecified: G47.00

## 2016-05-03 HISTORY — DX: Essential (primary) hypertension: I10

## 2016-05-03 LAB — BASIC METABOLIC PANEL
ANION GAP: 7 (ref 5–15)
BUN: 14 mg/dL (ref 6–20)
CALCIUM: 9.2 mg/dL (ref 8.9–10.3)
CO2: 23 mmol/L (ref 22–32)
Chloride: 108 mmol/L (ref 101–111)
Creatinine, Ser: 0.7 mg/dL (ref 0.44–1.00)
GFR calc Af Amer: >60 mL/min (ref >60)
GLUCOSE: 110 mg/dL — AB (ref 65–99)
Potassium: 4 mmol/L (ref 3.5–5.1)
Sodium: 138 mmol/L (ref 135–145)

## 2016-05-03 LAB — I-STAT TROPONIN, ED: TROPONIN I, POC: 0 ng/mL (ref 0.00–0.08)

## 2016-05-03 LAB — CBC
HCT: 38.6 % (ref 36.0–46.0)
Hemoglobin: 12.4 g/dL (ref 12.0–15.0)
MCH: 25.9 pg — ABNORMAL LOW (ref 26.0–34.0)
MCHC: 32.1 g/dL (ref 30.0–36.0)
MCV: 80.6 fL (ref 78.0–100.0)
PLATELETS: 225 10*3/uL (ref 150–400)
RBC: 4.79 MIL/uL (ref 3.87–5.11)
RDW: 13.1 % (ref 11.5–15.5)
WBC: 5.8 10*3/uL (ref 4.0–10.5)

## 2016-05-03 LAB — D-DIMER, QUANTITATIVE: D-Dimer, Quant: <0.27 ug/mL-FEU (ref 0.00–0.50)

## 2016-05-03 MED ORDER — AEROCHAMBER PLUS W/MASK MISC
Status: AC
  Administered 2016-05-03: 23:00:00
  Filled 2016-05-03: qty 1

## 2016-05-03 MED ORDER — ALBUTEROL SULFATE HFA 108 (90 BASE) MCG/ACT IN AERS
2.0000 | INHALATION_SPRAY | RESPIRATORY_TRACT | Status: DC | PRN
  Administered 2016-05-03: 2 via RESPIRATORY_TRACT
  Filled 2016-05-03: qty 6.7

## 2016-05-03 NOTE — ED Provider Notes (Signed)
CSN: [id number]     Arrival date & time 05/03/16  1723 History   First MD Initiated Contact with Patient 05/03/16 2040     Chief Complaint  Patient presents with  . Chest Pain     (Consider location/radiation/quality/duration/timing/severity/associated sxs/prior Treatment) HPI Complains of left anterior chest pain nonradiating onset yesterday afternoon pain is constant, pleuritic in nature. Accompanied by mild cough and shortness of breath. No treatment prior to coming here. Pain is worse with deep inspiration. Nonexertional. No other associated symptoms. Patient reports numbness in both hands when she falls asleep over the past several months. Last had numbness yesterday. Past Medical History  Diagnosis Date  . Thyroid disease   . Hidradenitis     in L groin required surgery  . Constipation   . Insomnia   . Hypertension    Past Surgical History  Procedure Laterality Date  . Appendectomy    . Cesarean section    . Breast lumpectomy      benign   History reviewed. No pertinent family history. Social History  Substance Use Topics  . Smoking status: Never Smoker   . Smokeless tobacco: Never Used  . Alcohol Use: Yes   OB History    Gravida Para Term Preterm AB TAB SAB Ectopic Multiple Living   Review of Systems  Constitutional: Negative.   HENT: Negative.   Respiratory: Positive for cough and shortness of breath.   Cardiovascular: Positive for chest pain.  Gastrointestinal: Negative.   Musculoskeletal: Negative.   Skin: Negative.   Neurological: Positive for numbness.  Psychiatric/Behavioral: Negative.   All other systems reviewed and are negative.     Allergies  Tape  Home Medications   Prior to Admission medications   Medication Sig Start Date End Date Taking? Authorizing Provider  amLODipine (NORVASC) 10 MG tablet Take 10 mg by mouth daily. 04/09/16  Yes Historical Provider, MD  ibuprofen (ADVIL,MOTRIN) 200 MG tablet Take 200 mg by mouth  daily as needed for pain. For pain   Yes Historical Provider, MD  levothyroxine (SYNTHROID, LEVOTHROID) 125 MCG tablet Take 125 mcg by mouth daily.    Yes Historical Provider, MD  doxycycline (VIBRAMYCIN) 100 MG capsule Take 1 capsule (100 mg total) by mouth 2 (two) times daily. Take for 10 days. 03/05/13   Brock Bad, MD  gabapentin (NEURONTIN) 300 MG capsule Take 1-3 capsules by mouth at bedtime. Reported on 05/03/2016 04/30/16   Historical Provider, MD  metroNIDAZOLE (FLAGYL) 500 MG tablet Take 1 tablet (500 mg total) by mouth 2 (two) times daily. 03/05/13   Brock Bad, MD   BP 149/93 mmHg  Pulse 104  Temp(Src) 98.7 F (37.1 C) (Oral)  Resp 16  SpO2 99%  LMP 03/21/2013 Physical Exam  Constitutional: She appears well-developed and well-nourished.  HENT:  Head: Normocephalic and atraumatic.  Eyes: Conjunctivae are normal. Pupils are equal, round, and reactive to light.  Neck: Neck supple. No tracheal deviation present. No thyromegaly present.  Cardiovascular: Regular rhythm.   No murmur heard. Mildly tachycardic  Pulmonary/Chest: Effort normal and breath sounds normal.  Left anterior chest tender, reproducing pain exactly  Abdominal: Soft. Bowel sounds are normal. She exhibits no distension. There is no tenderness.  Musculoskeletal: Normal range of motion. She exhibits no edema or tenderness.  Neurological: She is alert. Coordination normal.  Skin: Skin is warm and dry. No rash noted.  Psychiatric: She has a normal  mood and affect.  Nursing note and vitals reviewed.   ED Course  Procedures (including critical care time) Labs Review Labs Reviewed  BASIC METABOLIC PANEL - Abnormal; Notable for the following:    Glucose, Bld 110 (*)    All other components within normal limits  CBC - Abnormal; Notable for the following:    MCH 25.9 (*)    All other components within normal limits  D-DIMER, QUANTITATIVE (NOT AT [facility])  Rose[facility]a SensorI-STAT TROPOININ, ED    Imaging Review Dg Chest  2 View  05/03/2016  CLINICAL DATA:  Patient with chest pain for 2 days. EXAM: CHEST  2 VIEW COMPARISON:  Chest radiograph 11/01/2012. FINDINGS: Stable enlarged cardiac mediastinal contours. No consolidative pulmonary opacities. No pleural effusion or pneumothorax. Regional skeleton is unremarkable. IMPRESSION: No active cardiopulmonary disease. Electronically Signed   By: Annia Beltrew  Davis M.D.   On: 05/03/2016 18:25   I have personally reviewed and evaluated these images and lab results as part of my medical decision-making.   EKG Interpretation   Date/Time:  Thursday May 03 2016 17:28:17 EDT Ventricular Rate:  109 PR Interval:  144 QRS Duration: 72 QT Interval:  328 QTC Calculation: 441 R Axis:   86 Text Interpretation:  Sinus tachycardia Otherwise normal ECG No old  tracing to compare Confirmed by Taray Normoyle  MD, Aadhya Bustamante [phone]([id number]) on 05/03/2016  9:10:30 PM     10:50 PM patient resting comfortably. States discomfort is unchanged, however is mild. Chest x-ray viewed by me Results for orders placed or performed during the hospital encounter of 05/03/16  Basic metabolic panel  Result Value Ref Range   Sodium 138 135 - 145 mmol/L   Potassium 4.0 3.5 - 5.1 mmol/L   Chloride 108 101 - 111 mmol/L   CO2 23 22 - 32 mmol/L   Glucose, Bld 110 (H) 65 - 99 mg/dL   BUN 14 6 - 20 mg/dL   Creatinine, Ser 6.040.70 0.44 - 1.00 mg/dL   Calcium 9.2 8.9 - 54.010.3 mg/dL   GFR calc non Af Amer >60 >60 mL/min   GFR calc Af Amer >60 >60 mL/min   Anion gap 7 5 - 15  CBC  Result Value Ref Range   WBC 5.8 4.0 - 10.5 K/uL   RBC 4.79 3.87 - 5.11 MIL/uL   Hemoglobin 12.4 12.0 - 15.0 g/dL   HCT 98.138.6 19.136.0 - 47.846.0 %   MCV 80.6 78.0 - 100.0 fL   MCH 25.9 (L) 26.0 - 34.0 pg   MCHC 32.1 30.0 - 36.0 g/dL   RDW 29.513.1 62.111.5 - 30.815.5 %   Platelets 225 150 - 400 K/uL  D-dimer, quantitative (not at [facility])  Result Value Ref Range   D-Dimer, Quant <0.27 0.00 - 0.50 ug/mL-FEU  I-stat troponin, ED  Result Value Ref Range   Troponin  i, poc 0.00 0.00 - 0.08 ng/mL   Comment 3           Dg Chest 2 View  05/03/2016  CLINICAL DATA:  Patient with chest pain for 2 days. EXAM: CHEST  2 VIEW COMPARISON:  Chest radiograph 11/01/2012. FINDINGS: Stable enlarged cardiac mediastinal contours. No consolidative pulmonary opacities. No pleural effusion or pneumothorax. Regional skeleton is unremarkable. IMPRESSION: No active cardiopulmonary disease. Electronically Signed   By: Annia Beltrew  Davis M.D.   On: 05/03/2016 18:25    MDM  Strongly doubt cardiac etiology. Atypical story. Heart score equals 3. Low pretest clinical probability for pulmonary embolism. Negative d-dimer. Symptoms atypical for aortic aneurysm or  dissection. Symptoms more typical for acute bronchitis given cough and pleuritic pain. Plan albuterol HFA with spacer to go to use 2 puffs every 4 hours when necessary cough or shortness of breath. Follow-up with PMD if not better by next week Diagnosis atypical chest pain Final diagnoses:  None        Doug SouSam Korena Nass, MD 05/03/16 [id number]

## 2016-05-03 NOTE — Discharge Instructions (Signed)
Nonspecific Chest Pain  Use your albuterol inhaler 2 puffs every 4 hours as needed for cough or shortness of breath. Your symptoms are most consistent with bronchitis. Contact your primary care physician if not feeling better by next week to arrange to be seen in her office. Return if concerned for any reason. Chest pain can be caused by many different conditions. There is always a chance that your pain could be related to something serious, such as a heart attack or a blood clot in your lungs. Chest pain can also be caused by conditions that are not life-threatening. If you have chest pain, it is very important to follow up with your health care provider. CAUSES  Chest pain can be caused by:  Heartburn.  Pneumonia or bronchitis.  Anxiety or stress.  Inflammation around your heart (pericarditis) or lung (pleuritis or pleurisy).  A blood clot in your lung.  A collapsed lung (pneumothorax). It can develop suddenly on its own (spontaneous pneumothorax) or from trauma to the chest.  Shingles infection (varicella-zoster virus).  Heart attack.  Damage to the bones, muscles, and cartilage that make up your chest wall. This can include:  Bruised bones due to injury.  Strained muscles or cartilage due to frequent or repeated coughing or overwork.  Fracture to one or more ribs.  Sore cartilage due to inflammation (costochondritis). RISK FACTORS  Risk factors for chest pain may include:  Activities that increase your risk for trauma or injury to your chest.  Respiratory infections or conditions that cause frequent coughing.  Medical conditions or overeating that can cause heartburn.  Heart disease or family history of heart disease.  Conditions or health behaviors that increase your risk of developing a blood clot.  Having had chicken pox (varicella zoster). SIGNS AND SYMPTOMS Chest pain can feel like:  Burning or tingling on the surface of your chest or deep in your  chest.  Crushing, pressure, aching, or squeezing pain.  Dull or sharp pain that is worse when you move, cough, or take a deep breath.  Pain that is also felt in your back, neck, shoulder, or arm, or pain that spreads to any of these areas. Your chest pain may come and go, or it may stay constant. DIAGNOSIS Lab tests or other studies may be needed to find the cause of your pain. Your health care provider may have you take a test called an ambulatory ECG (electrocardiogram). An ECG records your heartbeat patterns at the time the test is performed. You may also have other tests, such as:  Transthoracic echocardiogram (TTE). During echocardiography, sound waves are used to create a picture of all of the heart structures and to look at how blood flows through your heart.  Transesophageal echocardiogram (TEE).This is a more advanced imaging test that obtains images from inside your body. It allows your health care provider to see your heart in finer detail.  Cardiac monitoring. This allows your health care provider to monitor your heart rate and rhythm in real time.  Holter monitor. This is a portable device that records your heartbeat and can help to diagnose abnormal heartbeats. It allows your health care provider to track your heart activity for several days, if needed.  Stress tests. These can be done through exercise or by taking medicine that makes your heart beat more quickly.  Blood tests.  Imaging tests. TREATMENT  Your treatment depends on what is causing your chest pain. Treatment may include:  Medicines. These may include:  Acid blockers  for heartburn.  Anti-inflammatory medicine.  Pain medicine for inflammatory conditions.  Antibiotic medicine, if an infection is present.  Medicines to dissolve blood clots.  Medicines to treat coronary artery disease.  Supportive care for conditions that do not require medicines. This may include:  Resting.  Applying heat or cold  packs to injured areas.  Limiting activities until pain decreases. HOME CARE INSTRUCTIONS  If you were prescribed an antibiotic medicine, finish it all even if you start to feel better.  Avoid any activities that bring on chest pain.  Do not use any tobacco products, including cigarettes, chewing tobacco, or electronic cigarettes. If you need help quitting, ask your health care provider.  Do not drink alcohol.  Take medicines only as directed by your health care provider.  Keep all follow-up visits as directed by your health care provider. This is important. This includes any further testing if your chest pain does not go away.  If heartburn is the cause for your chest pain, you may be told to keep your head raised (elevated) while sleeping. This reduces the chance that acid will go from your stomach into your esophagus.  Make lifestyle changes as directed by your health care provider. These may include:  Getting regular exercise. Ask your health care provider to suggest some activities that are safe for you.  Eating a heart-healthy diet. A registered dietitian can help you to learn healthy eating options.  Maintaining a healthy weight.  Managing diabetes, if necessary.  Reducing stress. SEEK MEDICAL CARE IF:  Your chest pain does not go away after treatment.  You have a rash with blisters on your chest.  You have a fever. SEEK IMMEDIATE MEDICAL CARE IF:   Your chest pain is worse.  You have an increasing cough, or you cough up blood.  You have severe abdominal pain.  You have severe weakness.  You faint.  You have chills.  You have sudden, unexplained chest discomfort.  You have sudden, unexplained discomfort in your arms, back, neck, or jaw.  You have shortness of breath at any time.  You suddenly start to sweat, or your skin gets clammy.  You feel nauseous or you vomit.  You suddenly feel light-headed or dizzy.  Your heart begins to beat quickly, or  it feels like it is skipping beats. These symptoms may represent a serious problem that is an emergency. Do not wait to see if the symptoms will go away. Get medical help right away. Call your local emergency services (911 in the [place] Do not drive yourself to the hospital.   This information is not intended to replace advice given to you by your health care provider. Make sure you discuss any questions you have with your health care provider.   Document Released: 08/08/2005 Document Revised: 11/19/2014 Document Reviewed: 06/04/2014 Elsevier Interactive Patient Education [employer].

## 2016-05-03 NOTE — ED Notes (Signed)
She c/o 2 day history of midsternal CP radiating under her L breast. Pain increased with inspiration. Pt denies cough, fevers, n/v. Pt reports fatigue, dizziness, numbness in bilateral hands. She is alert and breathing easily

## 2016-09-20 IMAGING — DX DG CHEST 2V
2 series · 2 of 2 positions shown · non-contrast
Comparison: Chest radiograph 11/01/2012.

CLINICAL DATA: Patient with chest pain for 2 days.

EXAM:
CHEST  2 VIEW

[chest pa]
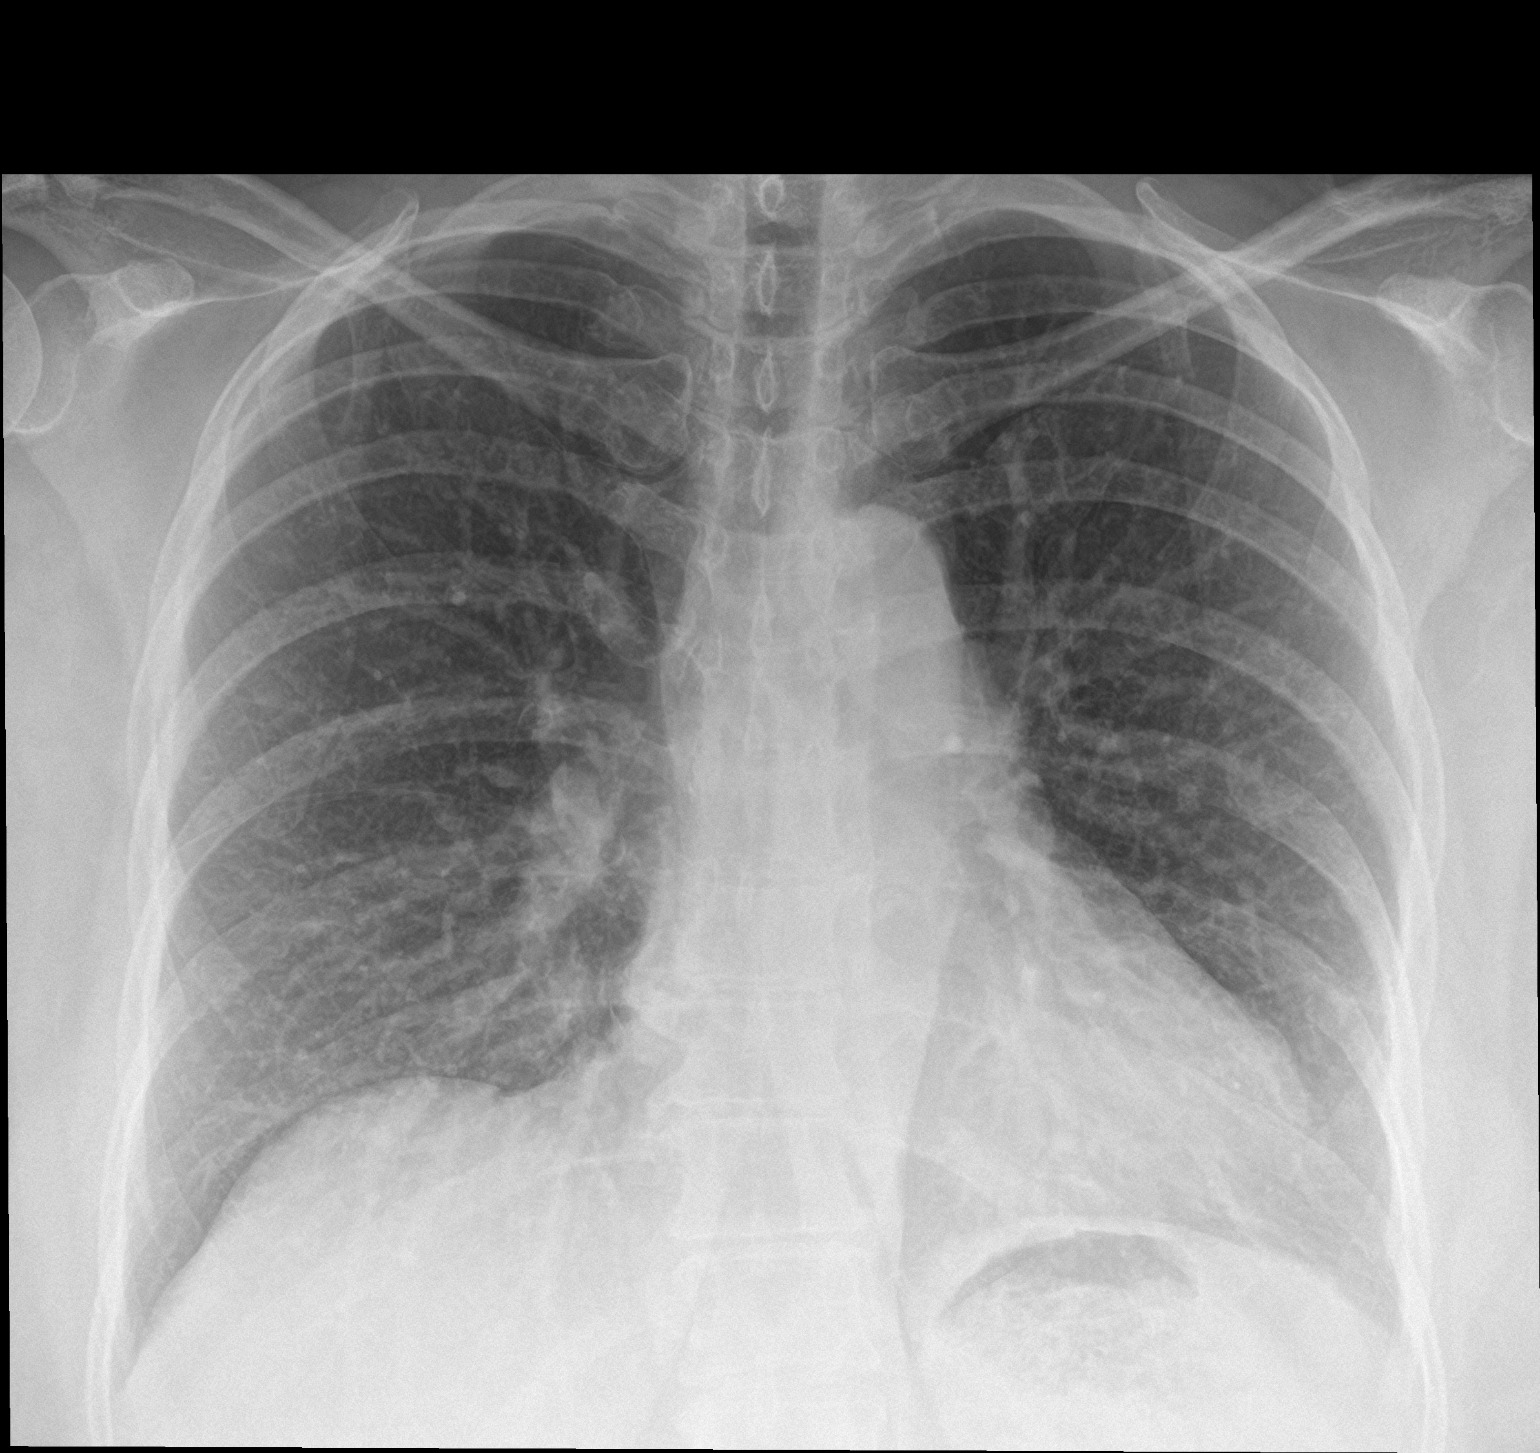

[chest lat]
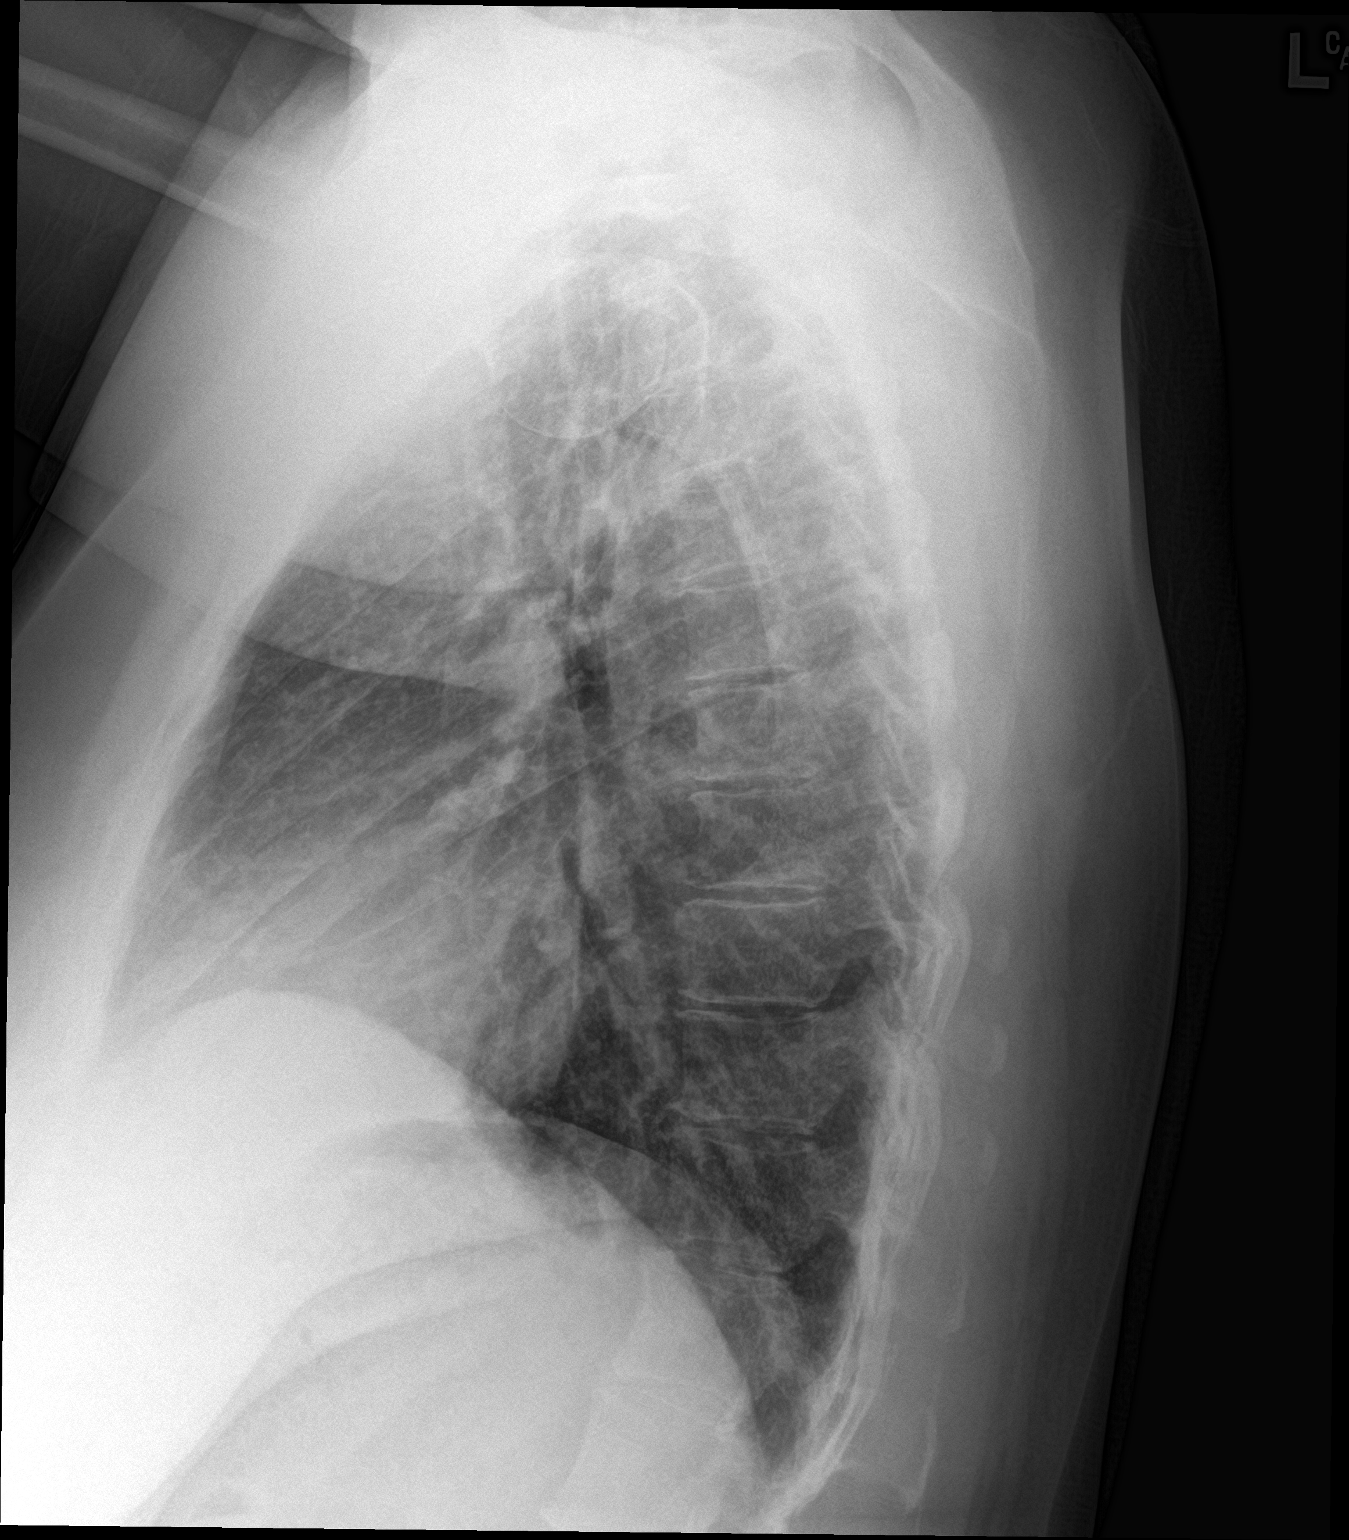

[2 of 2 positions shown; findings below may reference images not displayed]

FINDINGS: Stable enlarged cardiac mediastinal contours. No consolidative
pulmonary opacities. No pleural effusion or pneumothorax. Regional
skeleton is unremarkable.
IMPRESSION: No active cardiopulmonary disease.
